# Patient Record
Sex: Female | Born: 1950
Health system: Southern US, Community
[De-identification: ages and names within clinical notes are randomized; demographics above are authoritative.]

## PROBLEM LIST (undated history)

## (undated) DIAGNOSIS — M791 Myalgia, unspecified site: Secondary | ICD-10-CM

## (undated) DIAGNOSIS — T7840XA Allergy, unspecified, initial encounter: Secondary | ICD-10-CM

## (undated) DIAGNOSIS — K635 Polyp of colon: Secondary | ICD-10-CM

## (undated) DIAGNOSIS — T466X5A Adverse effect of antihyperlipidemic and antiarteriosclerotic drugs, initial encounter: Secondary | ICD-10-CM

## (undated) DIAGNOSIS — N390 Urinary tract infection, site not specified: Secondary | ICD-10-CM

## (undated) DIAGNOSIS — E785 Hyperlipidemia, unspecified: Secondary | ICD-10-CM

## (undated) DIAGNOSIS — M8580 Other specified disorders of bone density and structure, unspecified site: Secondary | ICD-10-CM

## (undated) HISTORY — PX: UPPER GASTROINTESTINAL ENDOSCOPY: SHX188

## (undated) HISTORY — DX: Myalgia, unspecified site: M79.10

## (undated) HISTORY — DX: Hyperlipidemia, unspecified: E78.5

## (undated) HISTORY — DX: Urinary tract infection, site not specified: N39.0

## (undated) HISTORY — DX: Allergy, unspecified, initial encounter: T78.40XA

## (undated) HISTORY — PX: OTHER SURGICAL HISTORY: SHX169

## (undated) HISTORY — PX: POLYPECTOMY: SHX149

## (undated) HISTORY — PX: COLONOSCOPY: SHX174

## (undated) HISTORY — DX: Other specified disorders of bone density and structure, unspecified site: M85.80

## (undated) HISTORY — DX: Adverse effect of antihyperlipidemic and antiarteriosclerotic drugs, initial encounter: T46.6X5A

## (undated) HISTORY — DX: Polyp of colon: K63.5

---

## 2015-06-10 DIAGNOSIS — M8580 Other specified disorders of bone density and structure, unspecified site: Secondary | ICD-10-CM

## 2015-06-10 HISTORY — DX: Other specified disorders of bone density and structure, unspecified site: M85.80

## 2015-09-12 DIAGNOSIS — M8580 Other specified disorders of bone density and structure, unspecified site: Secondary | ICD-10-CM | POA: Insufficient documentation

## 2016-01-29 DIAGNOSIS — D126 Benign neoplasm of colon, unspecified: Secondary | ICD-10-CM | POA: Insufficient documentation

## 2016-06-20 DIAGNOSIS — E7800 Pure hypercholesterolemia, unspecified: Secondary | ICD-10-CM | POA: Diagnosis not present

## 2016-06-20 DIAGNOSIS — R319 Hematuria, unspecified: Secondary | ICD-10-CM | POA: Diagnosis not present

## 2016-06-20 DIAGNOSIS — N390 Urinary tract infection, site not specified: Secondary | ICD-10-CM | POA: Diagnosis not present

## 2016-07-23 DIAGNOSIS — E7800 Pure hypercholesterolemia, unspecified: Secondary | ICD-10-CM | POA: Diagnosis not present

## 2017-01-19 DIAGNOSIS — E7800 Pure hypercholesterolemia, unspecified: Secondary | ICD-10-CM | POA: Diagnosis not present

## 2017-01-19 DIAGNOSIS — M8580 Other specified disorders of bone density and structure, unspecified site: Secondary | ICD-10-CM | POA: Diagnosis not present

## 2017-01-27 DIAGNOSIS — E7800 Pure hypercholesterolemia, unspecified: Secondary | ICD-10-CM | POA: Diagnosis not present

## 2017-01-27 DIAGNOSIS — Z0000 Encounter for general adult medical examination without abnormal findings: Secondary | ICD-10-CM | POA: Diagnosis not present

## 2017-02-03 DIAGNOSIS — R7989 Other specified abnormal findings of blood chemistry: Secondary | ICD-10-CM | POA: Diagnosis not present

## 2017-02-03 DIAGNOSIS — E785 Hyperlipidemia, unspecified: Secondary | ICD-10-CM | POA: Diagnosis not present

## 2017-02-03 DIAGNOSIS — Z23 Encounter for immunization: Secondary | ICD-10-CM | POA: Diagnosis not present

## 2017-03-17 DIAGNOSIS — R799 Abnormal finding of blood chemistry, unspecified: Secondary | ICD-10-CM | POA: Diagnosis not present

## 2017-04-15 DIAGNOSIS — R799 Abnormal finding of blood chemistry, unspecified: Secondary | ICD-10-CM | POA: Diagnosis not present

## 2017-06-08 DIAGNOSIS — Z1231 Encounter for screening mammogram for malignant neoplasm of breast: Secondary | ICD-10-CM | POA: Diagnosis not present

## 2017-06-08 LAB — HM MAMMOGRAPHY

## 2017-07-25 DIAGNOSIS — J0100 Acute maxillary sinusitis, unspecified: Secondary | ICD-10-CM | POA: Diagnosis not present

## 2017-10-02 ENCOUNTER — Ambulatory Visit (INDEPENDENT_AMBULATORY_CARE_PROVIDER_SITE_OTHER): Payer: Medicare Other | Admitting: Family Medicine

## 2017-10-02 ENCOUNTER — Encounter: Payer: Self-pay | Admitting: Family Medicine

## 2017-10-02 ENCOUNTER — Other Ambulatory Visit: Payer: Self-pay

## 2017-10-02 VITALS — BP 110/82 | HR 63 | Temp 98.1°F | Ht 65.0 in | Wt 148.8 lb

## 2017-10-02 DIAGNOSIS — M7551 Bursitis of right shoulder: Secondary | ICD-10-CM

## 2017-10-02 DIAGNOSIS — D126 Benign neoplasm of colon, unspecified: Secondary | ICD-10-CM

## 2017-10-02 DIAGNOSIS — E2839 Other primary ovarian failure: Secondary | ICD-10-CM | POA: Diagnosis not present

## 2017-10-02 DIAGNOSIS — E7801 Familial hypercholesterolemia: Secondary | ICD-10-CM | POA: Diagnosis not present

## 2017-10-02 DIAGNOSIS — M8580 Other specified disorders of bone density and structure, unspecified site: Secondary | ICD-10-CM | POA: Diagnosis not present

## 2017-10-02 DIAGNOSIS — M791 Myalgia, unspecified site: Secondary | ICD-10-CM

## 2017-10-02 DIAGNOSIS — T466X5A Adverse effect of antihyperlipidemic and antiarteriosclerotic drugs, initial encounter: Secondary | ICD-10-CM

## 2017-10-02 HISTORY — DX: Myalgia, unspecified site: M79.10

## 2017-10-02 HISTORY — DX: Adverse effect of antihyperlipidemic and antiarteriosclerotic drugs, initial encounter: T46.6X5A

## 2017-10-02 MED ORDER — PITAVASTATIN CALCIUM 1 MG PO TABS: 1.0000 mg | ORAL_TABLET | Freq: Every day | ORAL | 11 refills | Status: DC

## 2017-10-02 NOTE — Patient Instructions (Addendum)
Thank you for establishing and allowing me to continue caring for you. It means a lot to me.   Please schedule a follow up appointment with me in 6-12 weeks for recheck of cholesterol.  Please come fasting for this appointment.    ICE, use anti-inflammatories and do your shoulder rehab exercises!  Order has been placed for the Bone Density and colonoscopy.  You will receive a call to schedule these!   Medicare recommends an Annual Wellness Visit for all patients. Please schedule this to be done with our Nurse Educator, Maudie Mercury. This is an informative "talk" visit; it's goals are to ensure that your health care needs are being met and to give you education regarding avoiding falls, ensuring you are not suffering from depression or problems with memory or thinking, and to educate you on Advance Care Planning. It helps me take good care of you!       Calcium Intake Recommendations You can take Caltrate Plus twice a day or get it through your diet or other OTC supplements (Viactiv, OsCal etc)  Calcium is a mineral that affects many functions in the body, including:  Blood clotting.  Blood vessel function.  Nerve impulse conduction.  Hormone secretion.  Muscle contraction.  Bone and teeth functions.  Most of your body's calcium supply is stored in your bones and teeth. When your calcium stores are low, you may be at risk for low bone mass, bone loss, and bone fractures. Consuming enough calcium helps to grow healthy bones and teeth and to prevent breakdown over time. It is very important that you get enough calcium if you are:  A child undergoing rapid growth.  An adolescent girl.  A pre- or post-menopausal woman.  A woman whose menstrual cycle has stopped due to anorexia nervosa or regular intense exercise.  An individual with lactose intolerance or a milk allergy.  A vegetarian.  What is my plan? Try to consume the recommended amount of calcium daily based on your age.  Depending on your overall health, your health care provider may recommend increased calcium intake.General daily calcium intake recommendations by age are:  Birth to 6 months: 200 mg.  Infants 7 to 12 months: 260 mg.  Children 1 to 3 years: 700 mg.  Children 4 to 8 years: 1,000 mg.  Children 9 to 13 years: 1,300 mg.  Teens 14 to 18 years: 1,300 mg.  Adults 19 to 50 years: 1,000 mg.  Adult women 51 to 70 years: 1,200 mg.  Adult men 51 to 70 years: 1,000 mg.  Adults 71 years and older: 1,200 mg.  Pregnant and breastfeeding teens: 1,300 mg.  Pregnant and breastfeeding adults: 1,000 mg.  What do I need to know about calcium intake?  In order for the body to absorb calcium, it needs vitamin D. You can get vitamin D through (we recommend getting [phone] units of Vitamin D daily) ? Direct exposure of the skin to sunlight. ? Foods, such as egg yolks, liver, saltwater fish, and fortified milk. ? Supplements.  Consuming too much calcium may cause: ? Constipation. ? Decreased absorption of iron and zinc. ? Kidney stones.  Calcium supplements may interact with certain medicines. Check with your health care provider before starting any calcium supplements.  Try to get most of your calcium from food. What foods can I eat? Grains  Fortified oatmeal. Fortified ready-to-eat cereals. Fortified frozen waffles. Vegetables Turnip greens. Broccoli. Fruits Fortified orange juice. Meats and Other Protein Sources Canned sardines with bones. Canned  salmon with bones. Soy beans. Tofu. Baked beans. Almonds. [place] nuts. Sunflower seeds. Dairy Milk. Yogurt. Cheese. Cottage cheese. Beverages Fortified soy milk. Fortified rice milk. Sweets/Desserts Pudding. Ice Cream. Milkshakes. Blackstrap molasses. The items listed above may not be a complete list of recommended foods or beverages. Contact your dietitian for more options. What foods can affect my calcium intake? It may be more  difficult for your body to use calcium or calcium may leave your body more quickly if you consume large amounts of:  Sodium.  Protein.  Caffeine.  Alcohol.  This information is not intended to replace advice given to you by your health care provider. Make sure you discuss any questions you have with your health care provider. Document Released: 01/08/2004 Document Revised: 12/14/2015 Document Reviewed: 11/01/2013 Elsevier Interactive Patient Education  2018 [employer].

## 2017-10-02 NOTE — Progress Notes (Signed)
Subjective  CC:  Chief Complaint  Patient presents with  . Establish Care    Relocated here from [place], [place], Last Phys per Patient in 07/2016  . Shoulder Pain    Right Shoulder Pain after working out at [employer]     HPI: Carolyn Gonzales is a 67 y.o. female who presents to [place] at [facility] today to establish care with me as a new patient.   She has the following concerns or needs:  Pleasant 67 year old female who has lived in [place] prior, moved to [place] where she raised her family, and now has returned.  Her daughter is married and lives here as well.  She has a son who lives in [place].  She is happy and overall very healthy.  We reviewed her past medical history in detail and outlined below.  I reviewed records from care everywhere.  She was receiving her health care at [facility] in [place].  She has untreated familial hypercholesterolemia with most recent LDL of 200 in November 2018.  She eats a healthy low-fat diet and exercises 5 to 7 days weekly.  She does aerobic training and weightbearing exercises.  She has been on Zocor and Vytorin, both gave her myalgias.  She never tried a different statin.  She has no known vascular disease.  She does have a family history of late onset coronary artery disease.  She is never had hypertension or diabetes.  She is a non-smoker.  She takes 81 mg aspirin daily for vascular disease prevention.  History of osteopenia by chart review.  Has a calcium rich diet but does not take supplements.  I do not have the records of her most recent scan.  She is due for a repeat.  Her mother did have osteoporosis.  She never took estrogen after menopause.  New complaint: Has right-sided shoulder pain worse at night while she is lying on that side.  Onset about 2 months ago.  Does exercise regularly at the gym including strength training, weightlifting and using the machines.  No injury.  She does have full range of motion at that  joint but notices some pain with lifting the arm in the morning.  No neck pain or radicular symptoms.  No weakness.  Colonoscopy in 2013 with tubular adenoma.  She is overdue for a surveillance colonoscopy.  No family history of colon cancer.  Of note he had false positive HTLV 2 antibodies results in the past.  Had further work-up with hematology showing all negative test results.  We updated and reviewed the patient's past history in detail and it is documented below.  Patient Active Problem List   Diagnosis Date Noted  . Myalgia due to statin 10/02/2017    Failed zocor, vytorin   . Familial hypercholesteremia 10/02/2017  . Tubular adenoma of colon 01/29/2016    Per MRs, 11/14/2011, recheck due 5 yrs   . Osteopenia 09/12/2015    06/15/14 DEXA scanned in MRs    Health Maintenance  Topic Date Due  . Hepatitis C Screening  17-Aug-1950  . COLONOSCOPY  11/13/2016  . DEXA SCAN  08/07/2017  . INFLUENZA VACCINE  01/07/2018  . MAMMOGRAM  06/08/2018  . TETANUS/TDAP  01/14/2026  . PNA vac Low Risk Adult  Completed   Immunization History  Administered Date(s) Administered  . Pneumococcal Conjugate-13 02/04/2016  . Pneumococcal Polysaccharide-23 02/03/2017  . Tdap 01/15/2016  . Zoster 01/15/2010   Current Meds  Medication Sig  . aspirin 81  MG EC tablet     Allergies: Patient is allergic to codeine and ezetimibe-simvastatin. Past Medical History Patient  has a past medical history of Allergy, Colon polyps, Hyperlipidemia, and Myalgia due to statin (10/02/2017). Past Surgical History Patient  has a past surgical history that includes tigal ligation. Family History: Patient family history includes Arthritis in her mother; Healthy in her daughter; Heart attack in her brother, father, maternal grandmother, and paternal grandfather; Heart disease in her brother, father, maternal grandmother, and paternal grandfather; Osteoporosis in her mother. Social History:  Patient  reports that she  has never smoked. She has never used smokeless tobacco. She reports that she drinks alcohol. She reports that she does not use drugs.  Review of Systems: Constitutional: negative for fever or malaise Ophthalmic: negative for photophobia, double vision or loss of vision Cardiovascular: negative for chest pain, dyspnea on exertion, or new LE swelling Respiratory: negative for SOB or persistent cough Gastrointestinal: negative for abdominal pain, change in bowel habits or melena Genitourinary: negative for dysuria or gross hematuria Musculoskeletal: negative for new gait disturbance or muscular weakness Integumentary: negative for new or persistent rashes Neurological: negative for TIA or stroke symptoms Psychiatric: negative for SI or delusions Allergic/Immunologic: negative for hives  Patient Care Team    Relationship Specialty Notifications Start End  Leamon Arnt, MD PCP - General Family Medicine  10/02/17   Arrie Eastern    10/02/17    Comment: Eye Doctor   Germaine Pomfret    10/02/17    Comment: Dentist     Objective  Vitals: BP 110/82   Pulse 63   Temp 98.1 F (36.7 C)   Ht 5\' 5"  (1.651 m)   Wt 148 lb 12.8 oz (67.5 kg)   BMI 24.76 kg/m  General:  Well developed, well nourished, no acute distress  Psych:  Alert and oriented,normal mood and affect HEENT:  Normocephalic, atraumatic, non-icteric sclera, PERRL, oropharynx is without mass or exudate, supple neck without adenopathy, mass or thyromegaly Cardiovascular:  RRR without gallop, rub or murmur, nondisplaced PMI Respiratory:  Good breath sounds bilaterally, CTAB with normal respiratory effort MSK: no deformities, contusions. Joints are without erythema or swelling Right shoulder: Full range of motion, negative empty can testing, nontender Skin:  Warm, no rashes or suspicious lesions noted Neurologic:    Mental status is normal. Gross motor and sensory exams are normal. Normal gait  Assessment  1. Familial  hypercholesteremia   2. Myalgia due to statin   3. Bursitis of right shoulder   4. Osteopenia, unspecified location   5. Tubular adenoma of colon   6. Estrogen deficiency      Plan   Discussed familial hypercholesterolemia with positive family history.  Recommend trying newer statins to see if she can tolerate them. Livalo low dose was ordered.  We will recheck in 6 to 12 weeks with cholesterol levels and titrate dose up if she is tolerating it.  Continue baby aspirin  Bursitis due to overuse of right shoulder: Rice therapy and rehab exercises once improving.  Follow-up if worsening recommend rest from exercise for 2 weeks  Ordered repeat bone density to follow-up on osteopenia.  Gave handout on calcium and vitamin D recommendations.  Add supplements as needed.  Referral to our GI for colonoscopy surveillance, overdue.  Due for annual wellness visit for Medicare.  Follow up:  Return in about 3 months (around 01/01/2018) for follow up hypercholesterolemia, AWV at patient's convenience.  Commons side effects, risks, benefits, and alternatives  for medications and treatment plan prescribed today were discussed, and the patient expressed understanding of the given instructions. Patient is instructed to call or message via MyChart if he/she has any questions or concerns regarding our treatment plan. No barriers to understanding were identified. We discussed Red Flag symptoms and signs in detail. Patient expressed understanding regarding what to do in case of urgent or emergency type symptoms.   Medication list was reconciled, printed and provided to the patient in AVS. Patient instructions and summary information was reviewed with the patient as documented in the AVS. This note was prepared with assistance of Dragon voice recognition software. Occasional wrong-word or sound-a-like substitutions may have occurred due to the inherent limitations of voice recognition software  Orders Placed This  Encounter  Procedures  . HM MAMMOGRAPHY  . DG Bone Density  . Ambulatory referral to Gastroenterology   Meds ordered this encounter  Medications  . Pitavastatin Calcium (LIVALO) 1 MG TABS    Sig: Take 1 tablet (1 mg total) by mouth at bedtime.    Dispense:  30 tablet    Refill:  11

## 2017-10-05 ENCOUNTER — Other Ambulatory Visit: Payer: Self-pay | Admitting: Emergency Medicine

## 2017-10-05 MED ORDER — PITAVASTATIN CALCIUM 1 MG PO TABS: 1.0000 mg | ORAL_TABLET | Freq: Every day | ORAL | 11 refills | Status: DC

## 2017-10-06 ENCOUNTER — Telehealth: Payer: Self-pay | Admitting: Gastroenterology

## 2017-10-06 NOTE — Telephone Encounter (Signed)
Dr. Fuller Plan reviewed records. Ok to schedule pt for a colon. Spoke with pt, she was driving and will cb to schedule.

## 2017-10-06 NOTE — Telephone Encounter (Signed)
We have received a referral from pt's PCP on 10/02/17 am requesting pt being seen for a repeat colon. Records have been received and placed on 10/02/17 am DOD Dr. Lynne Leader desk for review.

## 2017-10-07 ENCOUNTER — Telehealth: Payer: Self-pay | Admitting: Family Medicine

## 2017-10-07 NOTE — Telephone Encounter (Signed)
Copied from [place]. Topic: Quick Communication - Rx Refill/Question >> Oct 07, 2017  9:45 AM Scherrie Gerlach wrote: Medication: Pitavastatin Calcium (LIVALO) 1 MG TABS Pharmacy advised pt this med not covered by medicare, and they sent a request to the dr to change.  But the pharmacy had not heard anything back. Pt states she discussed what she could not take, (Zocor and Lipitor) and so whatever else Dr Jonni Sanger suggest  [facility] [address] [place] - [address] [phone] (Phone) [phone] (Fax)

## 2017-10-07 NOTE — Telephone Encounter (Signed)
Please change to crestor 10mg  po qhs. #30 5 refill.

## 2017-10-07 NOTE — Telephone Encounter (Signed)
Patient states that she has also tried Crestor.  She said that when she takes this she feels like she has the flu with body aches.  She is asking if there is any way that we could do more blood work to see if she truly needs to be on something before we try another medication.    Patient is aware that PCP is out of office this afternoon and tomorrow, she is aware that it may be Friday before she hears back from [place]. Patient aware that we will call her with information from Dr. Jonni Sanger once she returns.

## 2017-10-09 NOTE — Telephone Encounter (Signed)
Left detailed message for patient informing her of the notes below.  CRM created - okay for PEC to discuss

## 2017-10-09 NOTE — Telephone Encounter (Signed)
OK. She did not tell me she had tried crestor. Because livalo is expensive, I recommend holding off for now. I recalculated her cardiovascular risk score and am comfortable with her continuing her healthy lifestyle. The risk score will continue to increase as she ages; we can wait for now; livalo is brand name now; we can see in the future if she tolerates with samples, then go from there.  Thanks, Dr. Jonni Sanger

## 2017-10-21 ENCOUNTER — Telehealth: Payer: Self-pay | Admitting: Emergency Medicine

## 2017-10-21 NOTE — Telephone Encounter (Signed)
Ship broker. Insurance will cover Rouvastatin or Pravastatin.   Please Advise.    Doloris Hall,  LPN

## 2017-10-21 NOTE — Telephone Encounter (Signed)
See telephone note. Holding off on statin for now.

## 2017-10-21 NOTE — Telephone Encounter (Signed)
Noted  

## 2017-10-28 ENCOUNTER — Ambulatory Visit (INDEPENDENT_AMBULATORY_CARE_PROVIDER_SITE_OTHER): Payer: Medicare Other | Admitting: Family Medicine

## 2017-10-28 ENCOUNTER — Other Ambulatory Visit: Payer: Self-pay

## 2017-10-28 ENCOUNTER — Encounter: Payer: Self-pay | Admitting: Family Medicine

## 2017-10-28 ENCOUNTER — Ambulatory Visit: Payer: PRIVATE HEALTH INSURANCE

## 2017-10-28 VITALS — BP 110/68 | HR 65 | Temp 98.5°F | Resp 15 | Ht 65.0 in | Wt 149.2 lb

## 2017-10-28 DIAGNOSIS — R82998 Other abnormal findings in urine: Secondary | ICD-10-CM

## 2017-10-28 DIAGNOSIS — E2839 Other primary ovarian failure: Secondary | ICD-10-CM

## 2017-10-28 DIAGNOSIS — R399 Unspecified symptoms and signs involving the genitourinary system: Secondary | ICD-10-CM

## 2017-10-28 LAB — POCT URINALYSIS DIPSTICK
GLUCOSE UA: NEGATIVE
NITRITE UA: NEGATIVE
PROTEIN UA: POSITIVE — AB
Spec Grav, UA: >=1.03 — AB (ref 1.010–1.025)
Urobilinogen, UA: 0.2 E.U./dL
pH, UA: 5.5 (ref 5.0–8.0)

## 2017-10-28 MED ORDER — CIPROFLOXACIN HCL 500 MG PO TABS: 500.0000 mg | ORAL_TABLET | Freq: Two times a day (BID) | ORAL | 0 refills | Status: AC

## 2017-10-28 NOTE — Patient Instructions (Addendum)
Follow-up as Needed  Call Dr. Silvio Pate office to schedule Colonoscopy  [phone]   Urinary Tract Infection, Adult A urinary tract infection (UTI) is an infection of any part of the urinary tract. The urinary tract includes the:  Kidneys.  Ureters.  Bladder.  Urethra.  These organs make, store, and get rid of pee (urine) in the body. Follow these instructions at home:  Take over-the-counter and prescription medicines only as told by your doctor.  If you were prescribed an antibiotic medicine, take it as told by your doctor. Do not stop taking the antibiotic even if you start to feel better.  Avoid the following drinks: ? Alcohol. ? Caffeine. ? Tea. ? Carbonated drinks.  Drink enough fluid to keep your pee clear or pale yellow.  Keep all follow-up visits as told by your doctor. This is important.  Make sure to: ? Empty your bladder often and completely. Do not to hold pee for long periods of time. ? Empty your bladder before and after sex. ? Wipe from front to back after a bowel movement if you are female. Use each tissue one time when you wipe. Contact a doctor if:  You have back pain.  You have a fever.  You feel sick to your stomach (nauseous).  You throw up (vomit).  Your symptoms do not get better after 3 days.  Your symptoms go away and then come back. Get help right away if:  You have very bad back pain.  You have very bad lower belly (abdominal) pain.  You are throwing up and cannot keep down any medicines or water. This information is not intended to replace advice given to you by your health care provider. Make sure you discuss any questions you have with your health care provider. Document Released: 11/12/2007 Document Revised: 11/01/2015 Document Reviewed: 04/16/2015 Elsevier Interactive Patient Education  [employer].

## 2017-10-28 NOTE — Progress Notes (Signed)
Subjective   CC:  Chief Complaint  Patient presents with  . Urinary Frequency    Burning with urination, started this morning, going out of town    HPI: Carolyn Gonzales is a 67 y.o. female who presents to the office today to address the problems listed above in the chief complaint. Patient reports dysuria and urinary frequency.  She has sensation of increased urinary pressure.  She denies fevers flank pain nausea vomiting or gross hematuria.  Symptoms have been present for several hours to days.  She denies history of interstitial cystitis.  She denies vaginal symptoms including vaginal discharge or pelvic pain. Takes AZO twice a week  Assessment  1. UTI symptoms   2. Leukocytes in urine   3. Estrogen deficiency      Plan   Start cipro; await culture.   Reordered dexa: CMA left message for patient to call to schedule.   Pt needs to call Dr. Silvio Pate office to set up colonoscopy.  Follow up: prn  Orders Placed This Encounter  Procedures  . Urine Culture  . DG Bone Density  . POCT Urinalysis Dipstick   Meds ordered this encounter  Medications  . ciprofloxacin (CIPRO) 500 MG tablet    Sig: Take 1 tablet (500 mg total) by mouth 2 (two) times daily for 3 days.    Dispense:  6 tablet    Refill:  0      I reviewed the patients updated PMH, FH, and SocHx.    Patient Active Problem List   Diagnosis Date Noted  . Myalgia due to statin 10/02/2017  . Familial hypercholesteremia 10/02/2017  . Tubular adenoma of colon 01/29/2016  . Osteopenia 09/12/2015   Current Meds  Medication Sig  . aspirin 81 MG EC tablet   . Cranberry-Vitamin C-Probiotic (AZO CRANBERRY PO) Take 1 capsule by mouth 2 (two) times a week.    Review of Systems: Cardiovascular: negative for chest pain Respiratory: negative for SOB or persistent cough Gastrointestinal: negative for abdominal pain Constitutional: Negative for fever malaise or anorexia  Objective  Vitals: BP 110/68   Pulse 65   Temp  98.5 F (36.9 C) (Oral)   Resp 15   Ht 5\' 5"  (1.651 m)   Wt 149 lb 3.2 oz (67.7 kg)   SpO2 97%   BMI 24.83 kg/m  General: no acute distress  Psych:  Alert and oriented, normal mood and affect Cardiovascular:  RRR without murmur or gallop. no peripheral edema Respiratory:  Good breath sounds bilaterally, CTAB with normal respiratory effort Gastrointestinal: soft, flat abdomen, normal active bowel sounds, no palpable masses, no hepatosplenomegaly, no appreciated hernias, NO CVAT, mild suprapubic ttp w/o rebound or guarding Skin:  Warm, no rashes Neurologic:   Mental status is normal. normal gait Office Visit on 10/28/2017  Component Date Value Ref Range Status  . Color, UA 10/28/2017 dark   Final  . Clarity, UA 10/28/2017 cloudy   Final  . Glucose, UA 10/28/2017 Negative  Negative Final  . Bilirubin, UA 10/28/2017 1+   Final  . Ketones, UA 10/28/2017 trace   Final  . Spec Grav, UA 10/28/2017 >=1.030* 1.010 - 1.025 Final  . Blood, UA 10/28/2017 3+   Final  . pH, UA 10/28/2017 5.5  5.0 - 8.0 Final  . Protein, UA 10/28/2017 Positive* Negative Final  . Urobilinogen, UA 10/28/2017 0.2  0.2 or 1.0 E.U./dL Final  . Nitrite, UA 10/28/2017 negative   Final  . Leukocytes, UA 10/28/2017 Large (3+)* Negative Final  Commons side effects, risks, benefits, and alternatives for medications and treatment plan prescribed today were discussed, and the patient expressed understanding of the given instructions. Patient is instructed to call or message via MyChart if he/she has any questions or concerns regarding our treatment plan. No barriers to understanding were identified. We discussed Red Flag symptoms and signs in detail. Patient expressed understanding regarding what to do in case of urgent or emergency type symptoms.   Medication list was reconciled, printed and provided to the patient in AVS. Patient instructions and summary information was reviewed with the patient as documented in the  AVS. This note was prepared with assistance of Dragon voice recognition software. Occasional wrong-word or sound-a-like substitutions may have occurred due to the inherent limitations of voice recognition software

## 2017-10-30 LAB — URINE CULTURE
MICRO NUMBER: 90622669
SPECIMEN QUALITY:: ADEQUATE

## 2017-10-30 NOTE — Progress Notes (Signed)
+   UTI sensitive to abx used. No further action needed

## 2017-11-03 ENCOUNTER — Encounter: Payer: Self-pay | Admitting: Gastroenterology

## 2017-11-09 DIAGNOSIS — Z780 Asymptomatic menopausal state: Secondary | ICD-10-CM | POA: Diagnosis not present

## 2017-11-09 DIAGNOSIS — M8589 Other specified disorders of bone density and structure, multiple sites: Secondary | ICD-10-CM | POA: Diagnosis not present

## 2017-11-09 LAB — HM DEXA SCAN

## 2017-11-11 NOTE — Progress Notes (Signed)
Please call patient: I have reviewed his/her lab results. Her bone density results show persistent mild low bone density. Continue with weight bearing exercises, calcium and Vit D supplementation: 600mg  calcium twice a day and 1000-2000units of OTC Vitamin D daily.

## 2017-11-24 ENCOUNTER — Ambulatory Visit (INDEPENDENT_AMBULATORY_CARE_PROVIDER_SITE_OTHER): Payer: Medicare Other | Admitting: Family Medicine

## 2017-11-24 ENCOUNTER — Other Ambulatory Visit: Payer: Self-pay

## 2017-11-24 ENCOUNTER — Encounter: Payer: Self-pay | Admitting: Family Medicine

## 2017-11-24 VITALS — BP 138/86 | HR 61 | Temp 97.8°F | Ht 65.0 in | Wt 152.2 lb

## 2017-11-24 DIAGNOSIS — R399 Unspecified symptoms and signs involving the genitourinary system: Secondary | ICD-10-CM

## 2017-11-24 LAB — POCT URINALYSIS DIPSTICK
Bilirubin, UA: NEGATIVE
Glucose, UA: NEGATIVE
KETONES UA: NEGATIVE
NITRITE UA: NEGATIVE
PH UA: 6.5 (ref 5.0–8.0)
PROTEIN UA: POSITIVE — AB
Spec Grav, UA: 1.01 (ref 1.010–1.025)
UROBILINOGEN UA: 0.2 U/dL

## 2017-11-24 MED ORDER — AMOXICILLIN 875 MG PO TABS: 875.0000 mg | ORAL_TABLET | Freq: Two times a day (BID) | ORAL | 0 refills | Status: AC

## 2017-11-24 NOTE — Patient Instructions (Addendum)
Please return in August or September for your annual complete physical; please come fasting.   If you have any questions or concerns, please don't hesitate to send me a message via MyChart or call the office at [phone]. Thank you for visiting with [place] today! It's our pleasure caring for you.  Prevention of UTI include cranberry supplements or juice; emptying bladder frequently, especially after intercourse, wiping front to back after using the bathroom.  If needed we can add an estrogen cream or an intermittent preventive antibiotic.

## 2017-11-24 NOTE — Progress Notes (Signed)
Subjective   CC:  Chief Complaint  Patient presents with  . Dysuria    burning with urination, patient states she saw blood in urine this morning and has taken Azo today     HPI: Carolyn Gonzales is a 67 y.o. female who presents to the office today to address the problems listed above in the chief complaint.  Patient reports urinary pressure and one episode of gross hematuria this am. No dysuria. Had E.coli UTI at end of may treated and resolved with cipro. Had h/o recurrent uti's about 2-3 years ago evaluated by urology. Told to use cranberry AZO supplements as a preventive; this worked well and no longer taking. Took one this am. These sxs are postcoital. She is postmenopausal.  She has sensation of increased urinary pressure.  She denies fevers flank pain nausea vomiting or gross hematuria. She denies history of interstitial cystitis.  She denies vaginal symptoms including vaginal discharge or pelvic pain.   Assessment  1. UTI symptoms      Plan   Acute cystitis: treat with amox and await culture. Discussed preventive measures. Consider abx prophylactics or estrogen creams if recur.  HM: has colonoscopy set up for august with dr. Fuller Plan. Due for cpe in august/sept   Follow up: Return in about 3 months (around 02/24/2018) for complete physical.  Orders Placed This Encounter  Procedures  . Urine Culture  . POCT urinalysis dipstick   Meds ordered this encounter  Medications  . amoxicillin (AMOXIL) 875 MG tablet    Sig: Take 1 tablet (875 mg total) by mouth 2 (two) times daily for 3 days.    Dispense:  6 tablet    Refill:  0      I reviewed the patients updated PMH, FH, and SocHx.    Patient Active Problem List   Diagnosis Date Noted  . Myalgia due to statin 10/02/2017  . Familial hypercholesteremia 10/02/2017  . Tubular adenoma of colon 01/29/2016  . Osteopenia 09/12/2015   Current Meds  Medication Sig  . aspirin 81 MG EC tablet   . calcium-vitamin D 250-100 MG-UNIT  tablet Take 1 tablet by mouth 2 (two) times daily.  . Cranberry-Vitamin C-Probiotic (AZO CRANBERRY PO) Take 1 capsule by mouth 2 (two) times a week.    Review of Systems: Cardiovascular: negative for chest pain Respiratory: negative for SOB or persistent cough Gastrointestinal: negative for abdominal pain Constitutional: Negative for fever malaise or anorexia  Objective  Vitals: BP 138/86   Pulse 61   Temp 97.8 F (36.6 C)   Ht 5\' 5"  (1.651 m)   Wt 152 lb 3.2 oz (69 kg)   SpO2 98%   BMI 25.33 kg/m  General: no acute distress  Psych:  Alert and oriented, normal mood and affect  Office Visit on 11/24/2017  Component Date Value Ref Range Status  . Color, UA 11/24/2017 amber   Final  . Clarity, UA 11/24/2017 cloudy   Final  . Glucose, UA 11/24/2017 Negative  Negative Final  . Bilirubin, UA 11/24/2017 negative   Final  . Ketones, UA 11/24/2017 negative   Final  . Spec Grav, UA 11/24/2017 1.010  1.010 - 1.025 Final  . Blood, UA 11/24/2017 3+   Final  . pH, UA 11/24/2017 6.5  5.0 - 8.0 Final  . Protein, UA 11/24/2017 Positive* Negative Final  . Urobilinogen, UA 11/24/2017 0.2  0.2 or 1.0 E.U./dL Final  . Nitrite, UA 11/24/2017 negative   Final  . Leukocytes, UA 11/24/2017 Moderate (  2+)* Negative Final    Commons side effects, risks, benefits, and alternatives for medications and treatment plan prescribed today were discussed, and the patient expressed understanding of the given instructions. Patient is instructed to call or message via MyChart if he/she has any questions or concerns regarding our treatment plan. No barriers to understanding were identified. We discussed Red Flag symptoms and signs in detail. Patient expressed understanding regarding what to do in case of urgent or emergency type symptoms.   Medication list was reconciled, printed and provided to the patient in AVS. Patient instructions and summary information was reviewed with the patient as documented in the  AVS. This note was prepared with assistance of Dragon voice recognition software. Occasional wrong-word or sound-a-like substitutions may have occurred due to the inherent limitations of voice recognition software

## 2017-11-25 LAB — URINE CULTURE
MICRO NUMBER: 90729838
SPECIMEN QUALITY: ADEQUATE

## 2017-11-26 NOTE — Progress Notes (Signed)
Please call patient: I have reviewed his/her lab results. Urine culture returned negative. Her irritative symptoms could be coming from dehydration or other things. Let me know if they persist.

## 2017-11-30 ENCOUNTER — Ambulatory Visit: Payer: PRIVATE HEALTH INSURANCE | Admitting: Family Medicine

## 2017-12-13 DIAGNOSIS — N390 Urinary tract infection, site not specified: Secondary | ICD-10-CM | POA: Diagnosis not present

## 2018-01-20 ENCOUNTER — Ambulatory Visit (AMBULATORY_SURGERY_CENTER): Payer: Self-pay

## 2018-01-20 VITALS — Ht 65.0 in | Wt 146.8 lb

## 2018-01-20 DIAGNOSIS — Z86010 Personal history of colonic polyps: Secondary | ICD-10-CM

## 2018-01-20 MED ORDER — PEG 3350-KCL-NA BICARB-NACL 420 G PO SOLR: 4000.0000 mL | Freq: Once | ORAL | 0 refills | Status: AC

## 2018-01-20 NOTE — Progress Notes (Signed)
No egg or soy allergy known to patient  No issues with past sedation with any surgeries  or procedures, no intubation problems  No diet pills per patient No home 02 use per patient  No blood thinners per patient  Pt denies issues with constipation  No A fib or A flutter  EMMI video sent to pt's e mail. Pt denies  

## 2018-01-28 ENCOUNTER — Telehealth: Payer: Self-pay | Admitting: Gastroenterology

## 2018-01-28 NOTE — Telephone Encounter (Signed)
Returning a pt call about a prep question. Left a voicemail at 12:54, for her to call us back. Lucina Mellow RN

## 2018-01-28 NOTE — Telephone Encounter (Signed)
Pt was given PEG 3350 - it has no name brand of Golytely- pt needs to know if this is ok-  Instructed her this is the same as Golytely and to follow our instructions not instructions on the bottle- pt verbalized understanding

## 2018-02-03 ENCOUNTER — Encounter: Payer: Self-pay | Admitting: Gastroenterology

## 2018-02-03 ENCOUNTER — Ambulatory Visit (AMBULATORY_SURGERY_CENTER): Payer: Medicare Other | Admitting: Gastroenterology

## 2018-02-03 VITALS — BP 151/63 | HR 56 | Temp 96.9°F | Resp 12

## 2018-02-03 DIAGNOSIS — D123 Benign neoplasm of transverse colon: Secondary | ICD-10-CM

## 2018-02-03 DIAGNOSIS — D124 Benign neoplasm of descending colon: Secondary | ICD-10-CM

## 2018-02-03 DIAGNOSIS — Z86010 Personal history of colonic polyps: Secondary | ICD-10-CM

## 2018-02-03 DIAGNOSIS — Z1211 Encounter for screening for malignant neoplasm of colon: Secondary | ICD-10-CM | POA: Diagnosis not present

## 2018-02-03 MED ORDER — SODIUM CHLORIDE 0.9 % IV SOLN: 500.0000 mL | Freq: Once | INTRAVENOUS | Status: DC

## 2018-02-03 NOTE — Progress Notes (Signed)
Called to room to assist during endoscopic procedure.  Patient ID and intended procedure confirmed with present staff. Received instructions for my participation in the procedure from the performing physician.  

## 2018-02-03 NOTE — Patient Instructions (Signed)
HANDOUTS GIVEN FOR POLYPS, DIVERTICULOSIS AND HEMORRHOIDS  YOU HAD AN ENDOSCOPIC PROCEDURE TODAY AT THE [facility]:   Refer to the procedure report that was given to you for any specific questions about what was found during the examination.  If the procedure report does not answer your questions, please call your gastroenterologist to clarify.  If you requested that your care partner not be given the details of your procedure findings, then the procedure report has been included in a sealed envelope for you to review at your convenience later.  YOU SHOULD EXPECT: Some feelings of bloating in the abdomen. Passage of more gas than usual.  Walking can help get rid of the air that was put into your GI tract during the procedure and reduce the bloating. If you had a lower endoscopy (such as a colonoscopy or flexible sigmoidoscopy) you may notice spotting of blood in your stool or on the toilet paper. If you underwent a bowel prep for your procedure, you may not have a normal bowel movement for a few days.  Please Note:  You might notice some irritation and congestion in your nose or some drainage.  This is from the oxygen used during your procedure.  There is no need for concern and it should clear up in a day or so.  SYMPTOMS TO REPORT IMMEDIATELY:   Following lower endoscopy (colonoscopy or flexible sigmoidoscopy):  Excessive amounts of blood in the stool  Significant tenderness or worsening of abdominal pains  Swelling of the abdomen that is new, acute  Fever of 100F or higher  For urgent or emergent issues, a gastroenterologist can be reached at any hour by calling [phone].   DIET:  We do recommend a small meal at first, but then you may proceed to your regular diet.  Drink plenty of fluids but you should avoid alcoholic beverages for 24 hours.  ACTIVITY:  You should plan to take it easy for the rest of today and you should NOT DRIVE or use heavy machinery until tomorrow  (because of the sedation medicines used during the test).    FOLLOW UP: Our staff will call the number listed on your records the next business day following your procedure to check on you and address any questions or concerns that you may have regarding the information given to you following your procedure. If we do not reach you, we will leave a message.  However, if you are feeling well and you are not experiencing any problems, there is no need to return our call.  We will assume that you have returned to your regular daily activities without incident.  If any biopsies were taken you will be contacted by phone or by letter within the next 1-3 weeks.  Please call us at [phone] if you have not heard about the biopsies in 3 weeks.    SIGNATURES/CONFIDENTIALITY: You and/or your care partner have signed paperwork which will be entered into your electronic medical record.  These signatures attest to the fact that that the information above on your After Visit Summary has been reviewed and is understood.  Full responsibility of the confidentiality of this discharge information lies with you and/or your care-partner. 

## 2018-02-03 NOTE — Progress Notes (Signed)
Pt's states no medical or surgical changes since previsit or office visit. 

## 2018-02-03 NOTE — Progress Notes (Signed)
A and O x3. Report to RN. Tolerated MAC anesthesia well.

## 2018-02-03 NOTE — Op Note (Signed)
[facility] Patient Name: Carolyn Gonzales Procedure Date: 02/03/2018 8:02 AM MRN: [id number] Endoscopist: Ladene Artist , MD Age: 67 Referring MD:  Date of Birth: [birth year] Gender: Female Account #: [id number] Procedure:                Colonoscopy Indications:              Surveillance: Personal history of adenomatous                            polyps on last colonoscopy > 5 years ago Medicines:                Monitored Anesthesia Care Procedure:                Pre-Anesthesia Assessment:                           - Prior to the procedure, a History and Physical                            was performed, and patient medications and                            allergies were reviewed. The patient's tolerance of                            previous anesthesia was also reviewed. The risks                            and benefits of the procedure and the sedation                            options and risks were discussed with the patient.                            All questions were answered, and informed consent                            was obtained. Prior Anticoagulants: The patient has                            taken no previous anticoagulant or antiplatelet                            agents. ASA Grade Assessment: II - A patient with                            mild systemic disease. After reviewing the risks                            and benefits, the patient was deemed in                            satisfactory condition to undergo the procedure.  After obtaining informed consent, the colonoscope                            was passed under direct vision. Throughout the                            procedure, the patient's blood pressure, pulse, and                            oxygen saturations were monitored continuously. The                            Colonoscope was introduced through the anus and                            advanced to the the  cecum, identified by                            appendiceal orifice and ileocecal valve. The                            ileocecal valve, appendiceal orifice, and rectum                            were photographed. The quality of the bowel                            preparation was good. The colonoscopy was performed                            without difficulty. The patient tolerated the                            procedure well. Scope In: 8:08:58 AM Scope Out: 8:23:33 AM Scope Withdrawal Time: 0 hours 10 minutes 46 seconds  Total Procedure Duration: 0 hours 14 minutes 35 seconds  Findings:                 The perianal and digital rectal examinations were                            normal.                           A 8 mm polyp was found in the descending colon. The                            polyp was sessile. The polyp was removed with a                            cold snare. Resection and retrieval were complete.                           A 4 mm polyp was found in the transverse colon. The  polyp was sessile. The polyp was removed with a                            cold biopsy forceps. Resection and retrieval were                            complete.                           A few small-mouthed diverticula were found in the                            left colon.                           External hemorrhoids were found during retroflexion.                           The exam was otherwise without abnormality on                            direct and retroflexion views. Complications:            No immediate complications. Estimated blood loss:                            None. Estimated Blood Loss:     Estimated blood loss: none. Impression:               - One 8 mm polyp in the descending colon, removed                            with a cold snare. Resected and retrieved.                           - One 4 mm polyp in the transverse colon, removed                             with a cold biopsy forceps. Resected and retrieved.                           - Diverticulosis in the left colon.                           - External hemorrhoids.                           - The examination was otherwise normal on direct                            and retroflexion views. Recommendation:           - Repeat colonoscopy in 5 years for surveillance.                           - Patient has a contact number available for  emergencies. The signs and symptoms of potential                            delayed complications were discussed with the                            patient. Return to normal activities tomorrow.                            Written discharge instructions were provided to the                            patient.                           - Resume previous diet.                           - Continue present medications.                           - Await pathology results. Ladene Artist, MD 02/03/2018 8:27:29 AM This report has been signed electronically.

## 2018-02-04 ENCOUNTER — Telehealth: Payer: Self-pay

## 2018-02-04 NOTE — Telephone Encounter (Signed)
  Follow up Call-  Call back number 02/03/2018  Post procedure Call Back phone  # [phone]  Permission to leave phone message Yes     Patient questions:  Do you have a fever, pain , or abdominal swelling? No. Pain Score  0 *  Have you tolerated food without any problems? Yes.    Have you been able to return to your normal activities? Yes.    Do you have any questions about your discharge instructions: Diet   No. Medications  No. Follow up visit  No.  Do you have questions or concerns about your Care? No.  Actions: * If pain score is 4 or above: No action needed, pain <4.

## 2018-02-17 ENCOUNTER — Encounter: Payer: Self-pay | Admitting: Gastroenterology

## 2018-05-24 ENCOUNTER — Other Ambulatory Visit: Payer: Self-pay

## 2018-05-24 ENCOUNTER — Ambulatory Visit (INDEPENDENT_AMBULATORY_CARE_PROVIDER_SITE_OTHER): Payer: Medicare Other | Admitting: Family Medicine

## 2018-05-24 ENCOUNTER — Ambulatory Visit: Payer: Self-pay | Admitting: *Deleted

## 2018-05-24 ENCOUNTER — Encounter: Payer: Self-pay | Admitting: Family Medicine

## 2018-05-24 VITALS — BP 138/86 | HR 72 | Temp 98.1°F | Ht 65.0 in | Wt 152.2 lb

## 2018-05-24 DIAGNOSIS — R519 Headache, unspecified: Secondary | ICD-10-CM

## 2018-05-24 DIAGNOSIS — R51 Headache: Secondary | ICD-10-CM | POA: Diagnosis not present

## 2018-05-24 DIAGNOSIS — R030 Elevated blood-pressure reading, without diagnosis of hypertension: Secondary | ICD-10-CM

## 2018-05-24 DIAGNOSIS — Z1239 Encounter for other screening for malignant neoplasm of breast: Secondary | ICD-10-CM | POA: Diagnosis not present

## 2018-05-24 NOTE — Progress Notes (Signed)
Subjective  CC:  Chief Complaint  Patient presents with  . Hypertension    since last friday, 160's/85's  . Shoulder Pain    Left shoulder pain, pained stopped when she quit excercising and since returning to excercising it is hurting agiain     HPI: Carolyn Gonzales is a 67 y.o. female who presents to the office today to address the problems listed above in the chief complaint.  Healthy 67 year old female with history of hyperlipidemia but not hypertension presents due to elevated blood pressure readings at home.  3 days ago had an atypical headache, mild to moderate in nature.  At that time she checked her blood pressure and it was elevated.  Since, she is checked multiple times with persistent elevated readings.  See telephone triage note.  She has had no further headaches or any other neurologic symptoms.  She denies chest pain, palpitations, shortness of breath or lower extremity edema.  She is had no history of essential hypertension but she has had an elevated reading here and there in the past.  She is never been on blood pressure medications.  Overall she is happy and healthy.  Exercises 5 days a week at the gym using free weights and machines and bicycling for cardio.  She has had no exercise-induced symptoms.  Of note she did have right shoulder pain while doing a certain core exercise.  No pain when not doing that specific exercise.  She denies stressors, anxiety, mood changes.  She does worry because she is going out of town next week.  She worries about a stroke.  Health maintenance: Due for complete physical with lab work.  Due for mammogram.  Patient refuses influenza vaccinations.  Education given  Assessment  1. Elevated blood pressure reading without diagnosis of hypertension   2. Nonintractable headache, unspecified chronicity pattern, unspecified headache type   3. Breast cancer screening      Plan    Elevated blood pressure reading without history of hypertension:  Education and counseling given.  Blood pressure normalized with lying in the office for a few minutes.  Recommend daily breathing/relaxation exercises.  Continue home monitoring.  If elevated readings persist, will start antihypertensives at next visit.  Discussed hypertensive urgency or crisis and neurologic symptoms associated with hypertensive urgency's.  Hyperlipidemia f/u: Patient declines statins due to history of intolerance  Headache: Resolved.  No red flags.  Health maintenance: Schedule physical with blood work, order mammogram today. Education regarding management of these chronic disease states was given. Management strategies discussed on successive visits include dietary and exercise recommendations, goals of achieving and maintaining IBW, and lifestyle modifications aiming for adequate sleep and minimizing stressors.   Follow up: No follow-ups on file.  Orders Placed This Encounter  Procedures  . MM DIGITAL SCREENING BILATERAL   No orders of the defined types were placed in this encounter.     BP Readings from Last 3 Encounters:  05/24/18 138/86  02/03/18 (!) 151/63  11/24/17 138/86   Wt Readings from Last 3 Encounters:  05/24/18 152 lb 3.2 oz (69 kg)  01/20/18 146 lb 12.8 oz (66.6 kg)  11/24/17 152 lb 3.2 oz (69 kg)    No results found for: CHOL No results found for: HDL No results found for: LDLCALC No results found for: TRIG No results found for: CHOLHDL No results found for: LDLDIRECT No results found for: CREATININE, BUN, NA, K, CL, CO2  The ASCVD Risk score Mikey Bussing DC Jr., et al., 2013) failed  to calculate for the following reasons:   Cannot find a previous HDL lab  I reviewed the patients updated PMH, FH, and SocHx.    Patient Active Problem List   Diagnosis Date Noted  . Myalgia due to statin 10/02/2017  . Familial hypercholesteremia 10/02/2017  . Tubular adenoma of colon 01/29/2016  . Osteopenia 09/12/2015    Allergies: Codeine and  Ezetimibe-simvastatin  Social History: Patient  reports that she has never smoked. She has never used smokeless tobacco. She reports current alcohol use. She reports that she does not use drugs.  Current Meds  Medication Sig  . aspirin 81 MG EC tablet   . calcium-vitamin D 250-100 MG-UNIT tablet Take 1 tablet by mouth 2 (two) times daily.  . Cranberry-Vitamin C-Probiotic (AZO CRANBERRY PO) Take 1 capsule by mouth 2 (two) times a week.  . fluticasone (FLONASE) 50 MCG/ACT nasal spray     Review of Systems: Cardiovascular: negative for chest pain, palpitations, leg swelling, orthopnea Respiratory: negative for SOB, wheezing or persistent cough Gastrointestinal: negative for abdominal pain Genitourinary: negative for dysuria or gross hematuria  Objective  Vitals: BP 138/86   Pulse 72   Temp 98.1 F (36.7 C)   Ht 5\' 5"  (1.651 m)   Wt 152 lb 3.2 oz (69 kg)   SpO2 98%   BMI 25.33 kg/m  General: no acute distress  Psych:  Alert and oriented, normal mood and affect HEENT:  Normocephalic, atraumatic, supple neck  Cardiovascular:  RRR without murmur. no edema Respiratory:  Good breath sounds bilaterally, CTAB with normal respiratory effort Skin:  Warm, no rashes Neurologic:   Mental status is normal, no tremor  Commons side effects, risks, benefits, and alternatives for medications and treatment plan prescribed today were discussed, and the patient expressed understanding of the given instructions. Patient is instructed to call or message via MyChart if he/she has any questions or concerns regarding our treatment plan. No barriers to understanding were identified. We discussed Red Flag symptoms and signs in detail. Patient expressed understanding regarding what to do in case of urgent or emergency type symptoms.   Medication list was reconciled, printed and provided to the patient in AVS. Patient instructions and summary information was reviewed with the patient as documented in the  AVS. This note was prepared with assistance of Dragon voice recognition software. Occasional wrong-word or sound-a-like substitutions may have occurred due to the inherent limitations of voice recognition software

## 2018-05-24 NOTE — Telephone Encounter (Signed)
Pt called with complaints of high blood pressure; she says that she woke up with a nagging headache of Friday which resolved after taking aleve; she says her BP has been running high;  her readings are as follows: 05/24/18 177/90 at 0600 05/23/18 162/87 at 0600                 164/92 at 0900                 161/86 at 1600 05/22/18  145/82 at 2030                 162/80 at 0600 05/21/18  147/80 at 0600 Recommendations made per nurse triage protocol; pt offered and accepted appointment with Dr Billey Chang, [facility], 05/24/18 at 1500; she verbalized understanding; will route to office for notification of this upcoming appointment.  Reason for Disposition . Systolic BP  >= 778 OR Diastolic >= 242  Answer Assessment - Initial Assessment Questions 1. BLOOD PRESSURE: "What is the blood pressure?" "Did you take at least two measurements 5 minutes apart?"    Yes last measurement 05/24/18 177/90 and 05/23/18 161/86 at 1600   2. ONSET: "When did you take your blood pressure?"      3. HOW: "How did you obtain the blood pressure?" (e.g., visiting nurse, automatic home BP monitor)     Home automatic cuff left upper arm  4. HISTORY: "Do you have a history of high blood pressure?"     no 5. MEDICATIONS: "Are you taking any medications for blood pressure?" "Have you missed any doses recently?"     no 6. OTHER SYMPTOMS: "Do you have any symptoms?" (e.g., headache, chest pain, blurred vision, difficulty breathing, weakness)     no 7. PREGNANCY: "Is there any chance you are pregnant?" "When was your last menstrual period?"     no  Protocols used: HIGH BLOOD PRESSURE-A-AH

## 2018-05-24 NOTE — Patient Instructions (Addendum)
Please return for your annual complete physical; please come fasting.   If you have any questions or concerns, please don't hesitate to send me a message via MyChart or call the office at [phone]. Thank you for visiting with [place] today! It's our pleasure caring for you.  Avoid salt and caffeine for now.   We will call you with information regarding your referral appointment. Mammogram at the [facility].  If you do not hear from [place] within the next 2 weeks, please let me know. It can take 1-2 weeks to get appointments set up with the specialists.

## 2018-06-28 ENCOUNTER — Ambulatory Visit (INDEPENDENT_AMBULATORY_CARE_PROVIDER_SITE_OTHER): Payer: Medicare Other | Admitting: Family Medicine

## 2018-06-28 ENCOUNTER — Encounter: Payer: Self-pay | Admitting: Family Medicine

## 2018-06-28 VITALS — BP 122/80 | HR 64 | Temp 98.0°F | Resp 16 | Ht 65.0 in | Wt 154.0 lb

## 2018-06-28 DIAGNOSIS — Z1159 Encounter for screening for other viral diseases: Secondary | ICD-10-CM

## 2018-06-28 DIAGNOSIS — T466X5A Adverse effect of antihyperlipidemic and antiarteriosclerotic drugs, initial encounter: Secondary | ICD-10-CM | POA: Diagnosis not present

## 2018-06-28 DIAGNOSIS — M791 Myalgia, unspecified site: Secondary | ICD-10-CM | POA: Diagnosis not present

## 2018-06-28 DIAGNOSIS — E7801 Familial hypercholesterolemia: Secondary | ICD-10-CM

## 2018-06-28 DIAGNOSIS — R030 Elevated blood-pressure reading, without diagnosis of hypertension: Secondary | ICD-10-CM

## 2018-06-28 DIAGNOSIS — M8580 Other specified disorders of bone density and structure, unspecified site: Secondary | ICD-10-CM | POA: Diagnosis not present

## 2018-06-28 DIAGNOSIS — D126 Benign neoplasm of colon, unspecified: Secondary | ICD-10-CM | POA: Diagnosis not present

## 2018-06-28 LAB — COMPREHENSIVE METABOLIC PANEL
ALT: 15 U/L (ref 0–35)
AST: 19 U/L (ref 0–37)
Albumin: 4.1 g/dL (ref 3.5–5.2)
Alkaline Phosphatase: 46 U/L (ref 39–117)
BILIRUBIN TOTAL: 0.5 mg/dL (ref 0.2–1.2)
BUN: 21 mg/dL (ref 6–23)
CO2: 29 mEq/L (ref 19–32)
Calcium: 9.7 mg/dL (ref 8.4–10.5)
Chloride: 106 mEq/L (ref 96–112)
Creatinine, Ser: 0.99 mg/dL (ref 0.40–1.20)
GFR: 55.52 mL/min — ABNORMAL LOW (ref >60.00)
Glucose, Bld: 77 mg/dL (ref 70–99)
Potassium: 4.1 mEq/L (ref 3.5–5.1)
SODIUM: 144 meq/L (ref 135–145)
Total Protein: 6.9 g/dL (ref 6.0–8.3)

## 2018-06-28 LAB — CBC WITH DIFFERENTIAL/PLATELET
BASOS ABS: 0 10*3/uL (ref 0.0–0.1)
Basophils Relative: 0.4 % (ref 0.0–3.0)
Eosinophils Absolute: 0.1 10*3/uL (ref 0.0–0.7)
Eosinophils Relative: 1.5 % (ref 0.0–5.0)
HCT: 41.8 % (ref 36.0–46.0)
HEMOGLOBIN: 14.3 g/dL (ref 12.0–15.0)
Lymphocytes Relative: 27.4 % (ref 12.0–46.0)
Lymphs Abs: 1.6 10*3/uL (ref 0.7–4.0)
MCHC: 34.2 g/dL (ref 30.0–36.0)
MCV: 94.1 fl (ref 78.0–100.0)
Monocytes Absolute: 0.4 10*3/uL (ref 0.1–1.0)
Monocytes Relative: 7.2 % (ref 3.0–12.0)
Neutro Abs: 3.7 10*3/uL (ref 1.4–7.7)
Neutrophils Relative %: 63.5 % (ref 43.0–77.0)
Platelets: 223 10*3/uL (ref 150.0–400.0)
RBC: 4.44 Mil/uL (ref 3.87–5.11)
RDW: 13 % (ref 11.5–15.5)
WBC: 5.8 10*3/uL (ref 4.0–10.5)

## 2018-06-28 LAB — LIPID PANEL
Cholesterol: 332 mg/dL — ABNORMAL HIGH (ref 0–200)
HDL: 75 mg/dL (ref >39.00)
LDL Cholesterol: 229 mg/dL — ABNORMAL HIGH (ref 0–99)
NonHDL: 257.13
Total CHOL/HDL Ratio: 4
Triglycerides: 141 mg/dL (ref 0.0–149.0)
VLDL: 28.2 mg/dL (ref 0.0–40.0)

## 2018-06-28 LAB — TSH: TSH: 2.46 u[IU]/mL (ref 0.35–4.50)

## 2018-06-28 MED ORDER — ZOSTER VAC RECOMB ADJUVANTED 50 MCG/0.5ML IM SUSR: 0.5000 mL | Freq: Once | INTRAMUSCULAR | 0 refills | Status: AC

## 2018-06-28 NOTE — Patient Instructions (Signed)
Please return in 12 months for your annual complete physical; please come fasting.  Sooner if you note your blood pressure to be consistently elevated.  Start taking otc CoEnzyme Q10; this may help you tolerate a statin once or twice a week.   Please go get your shingrix when you can. I have given you a prescription today .  If you have any questions or concerns, please don't hesitate to send me a message via MyChart or call the office at [phone]. Thank you for visiting with [place] today! It's our pleasure caring for you.  Please do these things to maintain good health!   Exercise at least 30-45 minutes a day,  4-5 days a week.   Eat a low-fat diet with lots of fruits and vegetables, up to 7-9 servings per day.  Drink plenty of water daily. Try to drink 8 8oz glasses per day.  Seatbelts can save your life. Always wear your seatbelt.  Place Smoke Detectors on every level of your home and check batteries every year.  Schedule an appointment with an eye doctor for an eye exam every 1-2 years  Safe sex - use condoms to protect yourself from STDs if you could be exposed to these types of infections. Use birth control if you do not want to become pregnant and are sexually active.  Avoid heavy alcohol use. If you drink, keep it to less than 2 drinks/day and not every day.  [place].  Choose someone you trust that could speak for you if you became unable to speak for yourself.  Depression is common in our stressful world.If you're feeling down or losing interest in things you normally enjoy, please come in for a visit.  If anyone is threatening or hurting you, please get help. Physical or Emotional Violence is never OK.

## 2018-06-28 NOTE — Progress Notes (Signed)
Subjective  Chief Complaint  Patient presents with  . Annual Exam    Fasting  . Hypertension    Has been checking at home they vary but not as high as previous    HPI: Carolyn Gonzales is a 68 y.o. female who presents to [place] at [facility] today for a Female Wellness Visit. She also has the concerns and/or needs as listed above in the chief complaint. These will be addressed in addition to the Health Maintenance Visit.   Wellness Visit: annual visit with health maintenance review and exam without Pap  68 year old healthy female presents for wellness exam.  She is fasting today.  She is feeling very well.  Having her mammogram done this Friday.  Eye exam is up-to-date.  Bone density with osteopenia done in May.  She takes calcium and vitamin D.  Colon cancer screening is up-to-date.  Immunizations: Due for Shingrix.  Declines flu vaccination. Chronic disease f/u and/or acute problem visit: (deemed necessary to be done in addition to the wellness visit):  Elevated hypertension in December.  Has been checking twice daily and this has resolved.  Blood pressures averaging 120s to 130s over 80s.  Continues to exercise daily.  Normal energy levels.  History of hyperlipidemia with myalgias on statin.  Has never used with coenzyme Q 10.  Assessment  1. Familial hypercholesteremia   2. Myalgia due to statin   3. Elevated blood pressure reading without diagnosis of hypertension   4. Osteopenia, unspecified location   5. Need for hepatitis C screening test   6. Tubular adenoma of colon      Plan  Female Wellness Visit:  Age appropriate Health Maintenance and Prevention measures were discussed with patient. Included topics are cancer screening recommendations, ways to keep healthy (see AVS) including dietary and exercise recommendations, regular eye and dental care, use of seat belts, and avoidance of moderate alcohol use and tobacco use.  Await mammogram report  BMI:  discussed patient's BMI and encouraged positive lifestyle modifications to help get to or maintain a target BMI.  HM needs and immunizations were addressed and ordered. See below for orders. See HM and immunization section for updates.  Shingrix prescription given today.  Routine labs and screening tests ordered including cmp, cbc and lipids where appropriate.  Discussed recommendations regarding Vit D and calcium supplementation (see AVS)  She is due annual wellness visit at her convenience.  Chronic disease management visit and/or acute problem visit:  Elevated blood pressure without hypertension: Reassured.  Recheck in 6 to 12 months.  She will continue to check periodically at home.  Check lab work today.  Fasting for lipid recheck today.  Consider low-dose statin once to twice weekly with coenzyme Q 10.  Patient is willing to retry.  Osteopenia: Continue vitamin D, calcium and exercise.  Recheck in 2021. Follow up: Return in about 1 year (around 06/29/2019) for complete physical, AWV.  Orders Placed This Encounter  Procedures  . CBC with Differential/Platelet  . Comprehensive metabolic panel  . Hepatitis C antibody  . Lipid panel  . TSH   Meds ordered this encounter  Medications  . Zoster Vaccine Adjuvanted [facility]) injection    Sig: Inject 0.5 mLs into the muscle once for 1 dose. Please give 2nd dose 2-6 months after first dose    Dispense:  2 each    Refill:  0      Lifestyle: Body mass index is 25.63 kg/m. Wt Readings from Last 3 Encounters:  06/28/18 154 lb (69.9 kg)  05/24/18 152 lb 3.2 oz (69 kg)  01/20/18 146 lb 12.8 oz (66.6 kg)   Diet: low fat Exercise: frequently,   Patient Active Problem List   Diagnosis Date Noted  . Myalgia due to statin 10/02/2017    Failed zocor, vytorin   . Familial hypercholesteremia 10/02/2017  . Tubular adenoma of colon 01/29/2016    11/14/2011 and 2018.  Next due in 5 years.   . Osteopenia 09/12/2015    10/2017 DEXA:  osteopenia, lowest T = - 1.9 right femur. FRAX 18 and 3%; recheck 2 years.  06/15/14 DEXA scanned in MRs    Health Maintenance  Topic Date Due  . Hepatitis C Screening  01-19-1951  . MAMMOGRAM  06/08/2018  . DEXA SCAN  11/10/2019  . COLONOSCOPY  02/04/2023  . TETANUS/TDAP  01/14/2026  . PNA vac Low Risk Adult  Completed  . INFLUENZA VACCINE  Discontinued   Immunization History  Administered Date(s) Administered  . Pneumococcal Conjugate-13 02/04/2016  . Pneumococcal Polysaccharide-23 02/03/2017  . Tdap 01/15/2016  . Zoster 01/15/2010   We updated and reviewed the patient's past history in detail and it is documented below. Allergies: Patient is allergic to codeine and ezetimibe-simvastatin. Past Medical History Patient  has a past medical history of Allergy, Colon polyps, Hyperlipidemia, Myalgia due to statin (10/02/2017), Osteopenia (2017), and Recurrent UTI. Past Surgical History Patient  has a past surgical history that includes tigal ligation; Colonoscopy; Upper gastrointestinal endoscopy; and Polypectomy. Family History: Patient family history includes Arthritis in her mother; Healthy in her daughter; Heart attack in her brother, father, maternal grandmother, and paternal grandfather; Heart disease in her brother, father, maternal grandmother, and paternal grandfather; Osteoporosis in her mother. Social History:  Patient  reports that she has never smoked. She has never used smokeless tobacco. She reports current alcohol use. She reports that she does not use drugs.  Review of Systems: Constitutional: negative for fever or malaise Ophthalmic: negative for photophobia, double vision or loss of vision Cardiovascular: negative for chest pain, dyspnea on exertion, or new LE swelling Respiratory: negative for SOB or persistent cough Gastrointestinal: negative for abdominal pain, change in bowel habits or melena Genitourinary: negative for dysuria or gross hematuria, no abnormal  uterine bleeding or disharge Musculoskeletal: negative for new gait disturbance or muscular weakness Integumentary: negative for new or persistent rashes, no breast lumps Neurological: negative for TIA or stroke symptoms Psychiatric: negative for SI or delusions Allergic/Immunologic: negative for hives  Patient Care Team    Relationship Specialty Notifications Start End  Leamon Arnt, MD PCP - General Family Medicine  10/02/17   Arrie Eastern    10/02/17    Comment: Eye Doctor   Germaine Pomfret    10/02/17    Comment: Dentist     Objective  Vitals: BP 122/80   Pulse 64   Temp 98 F (36.7 C) (Oral)   Resp 16   Ht 5\' 5"  (1.651 m)   Wt 154 lb (69.9 kg)   SpO2 98%   BMI 25.63 kg/m  General:  Well developed, well nourished, no acute distress  Psych:  Alert and orientedx3,normal mood and affect HEENT:  Normocephalic, atraumatic, non-icteric sclera, PERRL, oropharynx is clear without mass or exudate, supple neck without adenopathy, mass or thyromegaly Cardiovascular:  Normal S1, S2, RRR without gallop, rub or murmur, nondisplaced PMI Respiratory:  Good breath sounds bilaterally, CTAB with normal respiratory effort Gastrointestinal: normal bowel sounds, soft, non-tender, no noted masses. No  HSM MSK: no deformities, contusions. Joints are without erythema or swelling. Spine and CVA region are nontender Skin:  Warm, no rashes or suspicious lesions noted Neurologic:    Mental status is normal. CN 2-11 are normal. Gross motor and sensory exams are normal. Normal gait. No tremor Breast Exam: No mass, skin retraction or nipple discharge is appreciated in either breast. No axillary adenopathy. Fibrocystic changes are not noted    Commons side effects, risks, benefits, and alternatives for medications and treatment plan prescribed today were discussed, and the patient expressed understanding of the given instructions. Patient is instructed to call or message via MyChart if he/she has any  questions or concerns regarding our treatment plan. No barriers to understanding were identified. We discussed Red Flag symptoms and signs in detail. Patient expressed understanding regarding what to do in case of urgent or emergency type symptoms.   Medication list was reconciled, printed and provided to the patient in AVS. Patient instructions and summary information was reviewed with the patient as documented in the AVS. This note was prepared with assistance of Dragon voice recognition software. Occasional wrong-word or sound-a-like substitutions may have occurred due to the inherent limitations of voice recognition software

## 2018-06-29 LAB — HEPATITIS C ANTIBODY
Hepatitis C Ab: NONREACTIVE
SIGNAL TO CUT-OFF: 0.02 (ref <1.00)

## 2018-06-29 MED ORDER — ROSUVASTATIN CALCIUM 5 MG PO TABS: 5.0000 mg | ORAL_TABLET | ORAL | 5 refills | Status: DC

## 2018-06-29 NOTE — Progress Notes (Signed)
Please call patient: I have reviewed his/her lab results. Everything looks good except for very high cholesterol. I have ordered crestor: we will see if she can tolerate low dose once or twice a weekly with the coQ10 as we discussed yesterday.

## 2018-06-29 NOTE — Addendum Note (Signed)
Addended by: Billey Chang on: 06/29/2018 01:13 PM   Modules accepted: Orders

## 2018-07-01 ENCOUNTER — Telehealth: Payer: Self-pay | Admitting: Family Medicine

## 2018-07-01 MED ORDER — AMOXICILLIN-POT CLAVULANATE 875-125 MG PO TABS: 1.0000 | ORAL_TABLET | Freq: Two times a day (BID) | ORAL | 0 refills | Status: DC

## 2018-07-01 NOTE — Telephone Encounter (Signed)
Copied from [place] [phone]. Topic: General - Inquiry >> Jul 01, 2018  8:22 AM Margot Ables wrote: Reason for CRM: Pt called stating she is having pressure in the front of her face, upper jaw is hurting, and congestion. Pt is using zyrtec and netipot but they are not helping now. Pt requesting medication be sent in for her and states Dr. Jonni Sanger offered on 06/28/2018.  [id number] [place], [place] [address] [phone] (Phone) [phone] (Fax)

## 2018-07-01 NOTE — Telephone Encounter (Signed)
ABX sent in for worsening sxs consistent with sinusitis.please notify pt.

## 2018-07-01 NOTE — Telephone Encounter (Signed)
Pt following up on request for abx. Pt now aware Dr Jonni Sanger id out of the office today, will return in the am.

## 2018-07-01 NOTE — Addendum Note (Signed)
Addended by: Billey Chang on: 07/01/2018 03:19 PM   Modules accepted: Orders

## 2018-07-02 ENCOUNTER — Ambulatory Visit
Admission: RE | Admit: 2018-07-02 | Discharge: 2018-07-02 | Disposition: A | Discharge status: Home / Self Care | Payer: Medicare Other | Source: Ambulatory Visit | Attending: Family Medicine | Admitting: Family Medicine

## 2018-07-02 DIAGNOSIS — Z1231 Encounter for screening mammogram for malignant neoplasm of breast: Secondary | ICD-10-CM | POA: Diagnosis not present

## 2018-07-02 DIAGNOSIS — Z1239 Encounter for other screening for malignant neoplasm of breast: Secondary | ICD-10-CM

## 2018-07-02 NOTE — Telephone Encounter (Signed)
Tried calling pt to notify RX was sent, unable to leave message.   PEC may give message to pt

## 2018-07-19 ENCOUNTER — Ambulatory Visit (INDEPENDENT_AMBULATORY_CARE_PROVIDER_SITE_OTHER): Payer: Medicare Other | Admitting: Family Medicine

## 2018-07-19 ENCOUNTER — Encounter: Payer: Self-pay | Admitting: Family Medicine

## 2018-07-19 VITALS — BP 118/68 | HR 64 | Temp 99.0°F | Resp 16 | Ht 65.0 in | Wt 154.8 lb

## 2018-07-19 DIAGNOSIS — J0100 Acute maxillary sinusitis, unspecified: Secondary | ICD-10-CM

## 2018-07-19 MED ORDER — AMOXICILLIN-POT CLAVULANATE 875-125 MG PO TABS: 1.0000 | ORAL_TABLET | Freq: Two times a day (BID) | ORAL | 0 refills | Status: AC

## 2018-07-19 NOTE — Patient Instructions (Signed)
Please follow up if symptoms do not improve or as needed.   Treat for 14 days and hold your zyrtec for now. Add mucinex.

## 2018-07-19 NOTE — Progress Notes (Signed)
Subjective   CC:  Chief Complaint  Patient presents with  . Sinusitis    Completed Augmentin, felt better for about 3 day. Reports that symptoms returned 07/14/18, she has been using flonase and zyrtec    HPI: Carolyn Gonzales is a 68 y.o. female who presents to the office today to address the problems listed above in the chief complaint. Patient presents due to recurring symptoms of sinusitis including left greater than right maxillary sinus tenderness and pressure associated with malaise and fatigue without shortness of breath, fevers, cough, sore throat or myalgias.  She does have thick drainage.  She completed a 7-day course of Augmentin which improved her symptoms but then they have recurred as noted above.  I reviewed the patients updated PMH, FH, and SocHx.    Patient Active Problem List   Diagnosis Date Noted  . Myalgia due to statin 10/02/2017  . Familial hypercholesteremia 10/02/2017  . Tubular adenoma of colon 01/29/2016  . Osteopenia 09/12/2015   Current Meds  Medication Sig  . aspirin 81 MG EC tablet   . calcium-vitamin D 250-100 MG-UNIT tablet Take 1 tablet by mouth 2 (two) times daily.  . Cranberry-Vitamin C-Probiotic (AZO CRANBERRY PO) Take 1 capsule by mouth 2 (two) times a week.  . fluticasone (FLONASE) 50 MCG/ACT nasal spray   . rosuvastatin (CRESTOR) 5 MG tablet Take 1 tablet (5 mg total) by mouth 2 (two) times a week.    Review of Systems: Cardiovascular: negative for chest pain Respiratory: negative for SOB or persistent cough Gastrointestinal: negative for abdominal pain Genitourinary: negative for dysuria or gross hematuria  Objective  Vitals: BP 118/68   Pulse 64   Temp 99 F (37.2 C) (Oral)   Resp 16   Ht 5\' 5"  (1.651 m)   Wt 154 lb 12.8 oz (70.2 kg)   SpO2 99%   BMI 25.76 kg/m  General: no acute distress  Psych:  Alert and oriented, normal mood and affect HEENT:  Normocephalic, atraumatic, TMs with serous effusions or retraction w/o  erythema, nasal mucosa is red with purulent drainage, tender maxillary sinus present, OP mild erythematous w/o eudate, supple neck without LAD Cardiovascular:  RRR without murmur or gallop. no peripheral edema Respiratory:  Good breath sounds bilaterally, CTAB with normal respiratory effort Skin:  Warm, no rashes Neurologic:   Mental status is normal. normal gait  Assessment  1. Subacute maxillary sinusitis      Plan    Sinusitis: History and exam is most consistent with bacterial sinus infection.  Augmentin for 2 more weeks ordered.  Patient to complete course of antibiotics, use supportive medications like mucolytics and decongestants as needed.  May use Tylenol or Advil if needed.  Symptoms should improve over the next 2 weeks.  Patient will return or call if symptoms persist or worsen.  Follow up: As needed   Commons side effects, risks, benefits, and alternatives for medications and treatment plan prescribed today were discussed, and the patient expressed understanding of the given instructions. Patient is instructed to call or message via MyChart if he/she has any questions or concerns regarding our treatment plan. No barriers to understanding were identified. We discussed Red Flag symptoms and signs in detail. Patient expressed understanding regarding what to do in case of urgent or emergency type symptoms.   Medication list was reconciled, printed and provided to the patient in AVS. Patient instructions and summary information was reviewed with the patient as documented in the AVS. This note was prepared  with assistance of Systems analyst. Occasional wrong-word or sound-a-like substitutions may have occurred due to the inherent limitations of voice recognition software  No orders of the defined types were placed in this encounter.  Meds ordered this encounter  Medications  . amoxicillin-clavulanate (AUGMENTIN) 875-125 MG tablet    Sig: Take 1 tablet by mouth 2  (two) times daily for 14 days.    Dispense:  28 tablet    Refill:  0

## 2018-07-29 ENCOUNTER — Telehealth: Payer: Self-pay

## 2018-07-29 MED ORDER — PREDNISONE 10 MG PO TABS: ORAL_TABLET | ORAL | 0 refills | Status: DC

## 2018-07-29 NOTE — Telephone Encounter (Signed)
Pt is aware.  

## 2018-07-29 NOTE — Addendum Note (Signed)
Addended by: Billey Chang on: 07/29/2018 01:27 PM   Modules accepted: Orders

## 2018-07-29 NOTE — Telephone Encounter (Signed)
Please let pt know that I sent in a prednisone taper.

## 2018-07-29 NOTE — Telephone Encounter (Signed)
Copied from [place] [phone]. Topic: General - Other >> Jul 29, 2018  1:01 PM Wynetta Emery, [place] C wrote: Reason for CRM: pt is calling in to make provider aware that she is not feeling any better and the amoxicillin isn't helping. Pt says that provider stated that she would send in prednisone if not better. Pt is requesting a Rx.   [id number] [place], [place] [phone] (Phone) [phone] (Fax)

## 2018-10-13 ENCOUNTER — Telehealth: Payer: Self-pay | Admitting: Family Medicine

## 2018-10-13 MED ORDER — ATORVASTATIN CALCIUM 10 MG PO TABS: ORAL_TABLET | ORAL | 3 refills | Status: DC

## 2018-10-13 NOTE — Addendum Note (Signed)
Addended by: Layla Barter on: 10/13/2018 11:34 AM   Modules accepted: Orders

## 2018-10-13 NOTE — Telephone Encounter (Signed)
Spoke with pt she reports that Lipitor is what caused muscle pain previously but, due to only taking 2x wk versus daily she is wanting to try it again. I have sent RX to preferred pharmacy

## 2018-10-13 NOTE — Telephone Encounter (Signed)
Can fill lipitor 10mg  2-3x/ week but please clarify because she has had muscle pain in the past with these medications?  And how much is 8 pills of crestor per month? 30 pills will last almost 4 months?

## 2018-10-13 NOTE — Telephone Encounter (Signed)
See note  Copied from [place] [phone]. Topic: General - Other >> Oct 13, 2018 10:12 AM Yvette Rack wrote: Reason for CRM: Pt requests a new Rx for either Lipitor or Zocor for her cholesterol. Pt stated she can not afford the other medication. Pt requests that [id number] [place], [place] [phone] (Phone) [phone] (Fax)

## 2018-10-13 NOTE — Telephone Encounter (Signed)
Please advise. She is currently taking Crester 5mg  twice a week

## 2019-03-17 ENCOUNTER — Other Ambulatory Visit: Payer: Self-pay

## 2019-03-17 ENCOUNTER — Ambulatory Visit (INDEPENDENT_AMBULATORY_CARE_PROVIDER_SITE_OTHER): Payer: Medicare Other

## 2019-03-17 ENCOUNTER — Encounter: Payer: Self-pay | Admitting: Family Medicine

## 2019-03-17 DIAGNOSIS — Z23 Encounter for immunization: Secondary | ICD-10-CM

## 2019-05-27 ENCOUNTER — Ambulatory Visit (INDEPENDENT_AMBULATORY_CARE_PROVIDER_SITE_OTHER): Payer: Medicare Other

## 2019-05-27 ENCOUNTER — Other Ambulatory Visit: Payer: Self-pay

## 2019-05-27 ENCOUNTER — Encounter (INDEPENDENT_AMBULATORY_CARE_PROVIDER_SITE_OTHER): Payer: Self-pay

## 2019-05-27 DIAGNOSIS — Z0000 Encounter for general adult medical examination without abnormal findings: Secondary | ICD-10-CM

## 2019-05-27 NOTE — Progress Notes (Signed)
This visit is being conducted via phone call due to the COVID-19 pandemic. This patient has given me verbal consent via phone to conduct this visit, patient states they are participating from their home address. Some vital signs may be absent or patient reported.   Patient identification: identified by name, DOB, and current address.  Location provider: [facility], Office Persons participating in the virtual visit: Denman George LPN, patient, spouse Olean Basher), and Dr. Billey Chang     Subjective:   Carolyn Gonzales is a 68 y.o. female who presents for an Initial Medicare Annual Wellness Visit.  Review of Systems     Cardiac Risk Factors include: advanced age (>68men, >40 women);dyslipidemia     Objective:    There were no vitals filed for this visit. There is no height or weight on file to calculate BMI.  Advanced Directives 05/27/2019  Does Patient Have a Medical Advance Directive? Yes  Type of Advance Directive Living will;Healthcare Power of [place] in Chart? No - copy requested    Current Medications (verified) Outpatient Encounter Medications as of 05/27/2019  Medication Sig  . aspirin 81 MG EC tablet   . atorvastatin (LIPITOR) 10 MG tablet 1 tab 2 times a week  . calcium-vitamin D 250-100 MG-UNIT tablet Take 1 tablet by mouth 2 (two) times daily.  . Cranberry-Vitamin C-Probiotic (AZO CRANBERRY PO) Take 1 capsule by mouth 2 (two) times a week.  . fluticasone (FLONASE) 50 MCG/ACT nasal spray   . [DISCONTINUED] predniSONE (DELTASONE) 10 MG tablet Take 4 tabs qd x 2 days, 3 qd x 2 days, 2 qd x 2d, 1qd x 3 days   No facility-administered encounter medications on file as of 05/27/2019.    Allergies (verified) Codeine and Ezetimibe-simvastatin   History: Past Medical History:  Diagnosis Date  . Allergy   . Colon polyps    Non- Maligant  . Hyperlipidemia   . Myalgia due to statin 10/02/2017  . Osteopenia 2017  .  Recurrent UTI    pt states she has not had a uti for two months.   Past Surgical History:  Procedure Laterality Date  . COLONOSCOPY    . POLYPECTOMY    . tigal ligation    . UPPER GASTROINTESTINAL ENDOSCOPY     Family History  Problem Relation Age of Onset  . Healthy Daughter   . Arthritis Mother   . Osteoporosis Mother   . Heart disease Father   . Heart attack Father   . Heart disease Brother   . Heart attack Brother   . Heart attack Maternal Grandmother   . Heart disease Maternal Grandmother   . Heart disease Paternal Grandfather   . Heart attack Paternal Grandfather   . Colon cancer Neg Hx   . Esophageal cancer Neg Hx   . Colon polyps Neg Hx   . Rectal cancer Neg Hx   . Stomach cancer Neg Hx    Social History   Socioeconomic History  . Marital status: Married    Spouse name: Not on file  . Number of children: 2  . Years of education: Not on file  . Highest education level: Not on file  Occupational History  . Occupation: Retired     Comment: Scientist, water quality  Tobacco Use  . Smoking status: Never Smoker  . Smokeless tobacco: Never Used  Substance and Sexual Activity  . Alcohol use: Yes    Comment: occasionally   . Drug use:  Never  . Sexual activity: Yes  Other Topics Concern  . Not on file  Social History Narrative  . Not on file   Social Determinants of Health   Financial Resource Strain:   . Difficulty of Paying Living Expenses: Not on file  Food Insecurity:   . Worried About Charity fundraiser in the Last Year: Not on file  . Ran Out of Food in the Last Year: Not on file  Transportation Needs:   . Lack of Transportation (Medical): Not on file  . Lack of Transportation (Non-Medical): Not on file  Physical Activity:   . Days of Exercise per Week: Not on file  . Minutes of Exercise per Session: Not on file  Stress:   . Feeling of Stress : Not on file  Social Connections:   . Frequency of Communication with Friends and Family: Not on file  .  Frequency of Social Gatherings with Friends and Family: Not on file  . Attends Religious Services: Not on file  . Active Member of Clubs or Organizations: Not on file  . Attends Archivist Meetings: Not on file  . Marital Status: Not on file    Tobacco Counseling Counseling given: Not Answered   Clinical Intake:  Pre-visit preparation completed: Yes  Pain : No/denies pain  Diabetes: No  How often do you need to have someone help you when you read instructions, pamphlets, or other written materials from your doctor or pharmacy?: 1 - Never  Interpreter Needed?: No  Information entered by :: Denman George LPN   Activities of Daily Living In your present state of health, do you have any difficulty performing the following activities: 05/27/2019 06/28/2018  Hearing? N N  Vision? N Y  Comment - Wears glasses  Difficulty concentrating or making decisions? N N  Walking or climbing stairs? N N  Dressing or bathing? N N  Doing errands, shopping? N N  Preparing Food and eating ? N -  Using the Toilet? N -  In the past six months, have you accidently leaked urine? N -  Do you have problems with loss of bowel control? N -  Managing your Medications? N -  Managing your Finances? N -  Housekeeping or managing your Housekeeping? N -  Some recent data might be hidden     Immunizations and Health Maintenance Immunization History  Administered Date(s) Administered  . Fluad Quad(high Dose 65+) 03/17/2019  . Pneumococcal Conjugate-13 02/04/2016  . Pneumococcal Polysaccharide-23 02/03/2017  . Tdap 01/15/2016  . Zoster 01/15/2010   There are no preventive care reminders to display for this patient.  Patient Care Team: Leamon Arnt, MD as PCP - General (Family Medicine) [place] any recent Medical Services you may have received from other than [facility] providers in the past year (date may be approximate).     Assessment:   This is a  routine wellness examination for [name].  Hearing/Vision screen No exam data present  Dietary issues and exercise activities discussed: Current Exercise Habits: Home exercise routine, Type of exercise: walking, Time (Minutes): 30, Frequency (Times/Week): 3, Weekly Exercise (Minutes/Week): 90, Intensity: Mild  Goals   None    Depression Screen PHQ 2/9 Scores 05/27/2019 06/28/2018 05/24/2018 10/02/2017  PHQ - 2 Score 0 0 0 0  PHQ- 9 Score - - - 0    Fall Risk Fall Risk  05/27/2019 06/28/2018 05/24/2018 10/02/2017  Falls in the past year? 0 0 0 No  Number  falls in past yr: - 0 - -  Injury with Fall? 0 0 - -  Follow up Falls evaluation completed;Education provided;Falls prevention discussed Falls evaluation completed - -    Is the patient's home free of loose throw rugs in walkways, pet beds, electrical cords, etc?   yes      Grab bars in the bathroom? yes      Handrails on the stairs?   yes      Adequate lighting?   yes   Cognitive Function: no cognitive concerns at this time  Cognitive Testing  Alert? Yes         Normal Appearance? N/a  Oriented to person? Yes           Place? Yes  Time? Yes  Recall of three objects? Yes  Can perform simple calculations? Yes  Displays appropriate judgment? Yes  Can read the correct time from a watch face? Yes   Screening Tests Health Maintenance  Topic Date Due  . MAMMOGRAM  07/03/2019  . DEXA SCAN  11/10/2019  . COLONOSCOPY  02/04/2023  . TETANUS/TDAP  01/14/2026  . Hepatitis C Screening  Completed  . PNA vac Low Risk Adult  Completed  . INFLUENZA VACCINE  Discontinued    Qualifies for Shingles Vaccine? Discussed and patient will check with pharmacy for coverage.  Patient education handout provided   Cancer Screenings: Lung: Low Dose CT Chest recommended if Age 3-80 years, 30 pack-year currently smoking OR have quit w/in 15years. Patient does not qualify. Breast: Up to date on Mammogram? Yes   Up to date of Bone Density/Dexa?  Yes Colorectal: colonoscopy 02/03/18 with Dr. Fuller Plan    Plan:  I have personally reviewed and addressed the Medicare Annual Wellness questionnaire and have noted the following in the patient's chart:  A. Medical and social history B. Use of alcohol, tobacco or illicit drugs  C. Current medications and supplements D. Functional ability and status E.  Nutritional status F.  Physical activity G. Advance directives H. List of other physicians I.  Hospitalizations, surgeries, and ER visits in previous 12 months J.  [place] such as hearing and vision if needed, cognitive and depression L. Referrals, records requested, and appointments- none   In addition, I have reviewed and discussed with patient certain preventive protocols, quality metrics, and best practice recommendations. A written personalized care plan for preventive services as well as general preventive health recommendations were provided to patient.   Signed,  Denman George, LPN  Nurse Health Advisor   Nurse Notes: no additional

## 2019-05-27 NOTE — Patient Instructions (Signed)
Carolyn Gonzales , Thank you for taking time to come for your Medicare Wellness Visit. I appreciate your ongoing commitment to your health goals. Please review the following plan we discussed and let me know if I can assist you in the future.   Screening recommendations/referrals: Colorectal Screening: up to date; last colonoscopy 02/03/18  Mammogram: up to date; last 07/02/18 Bone Density: up to date; last  11/09/17  Vision and Dental Exams: Recommended annual ophthalmology exams for early detection of glaucoma and other disorders of the eye Recommended annual dental exams for proper oral hygiene  Vaccinations: Influenza vaccine: completed 03/17/19 Pneumococcal vaccine: up to date; last 02/03/17 Tdap vaccine: up to date; last 01/15/16 Shingles vaccine: Please call your insurance company to determine your out of pocket expense for the Shingrix vaccine. You may receive this vaccine at your local pharmacy.  Advanced directives: Please bring a copy of your POA (Power of Attorney) and/or Living Will to your next appointment.  Goals: Recommend to drink at least 6-8 8oz glasses of water per day and consume a balanced diet rich in fresh fruits and vegetables.   Next appointment: Please schedule your Annual Wellness Visit with your Nurse Health Advisor in one year.  Preventive Care 70 Years and Older, Female Preventive care refers to lifestyle choices and visits with your health care provider that can promote health and wellness. What does preventive care include?  A yearly physical exam. This is also called an annual well check.  Dental exams once or twice a year.  Routine eye exams. Ask your health care provider how often you should have your eyes checked.  Personal lifestyle choices, including:  Daily care of your teeth and gums.  Regular physical activity.  Eating a healthy diet.  Avoiding tobacco and drug use.  Limiting alcohol use.  Practicing safe sex.  Taking low-dose aspirin every  day if recommended by your health care provider.  Taking vitamin and mineral supplements as recommended by your health care provider. What happens during an annual well check? The services and screenings done by your health care provider during your annual well check will depend on your age, overall health, lifestyle risk factors, and family history of disease. Counseling  Your health care provider may ask you questions about your:  Alcohol use.  Tobacco use.  Drug use.  Emotional well-being.  Home and relationship well-being.  Sexual activity.  Eating habits.  History of falls.  Memory and ability to understand (cognition).  Work and work Statistician.  Reproductive health. Screening  You may have the following tests or measurements:  Height, weight, and BMI.  Blood pressure.  Lipid and cholesterol levels. These may be checked every 5 years, or more frequently if you are over 70 years old.  Skin check.  Lung cancer screening. You may have this screening every year starting at age 44 if you have a 30-pack-year history of smoking and currently smoke or have quit within the past 15 years.  Fecal occult blood test (FOBT) of the stool. You may have this test every year starting at age 35.  Flexible sigmoidoscopy or colonoscopy. You may have a sigmoidoscopy every 5 years or a colonoscopy every 10 years starting at age 51.  Hepatitis C blood test.  Hepatitis B blood test.  Sexually transmitted disease (STD) testing.  Diabetes screening. This is done by checking your blood sugar (glucose) after you have not eaten for a while (fasting). You may have this done every 1-3 years.  Bone density  scan. This is done to screen for osteoporosis. You may have this done starting at age 52.  Mammogram. This may be done every 1-2 years. Talk to your health care provider about how often you should have regular mammograms. Talk with your health care provider about your test results,  treatment options, and if necessary, the need for more tests. Vaccines  Your health care provider may recommend certain vaccines, such as:  Influenza vaccine. This is recommended every year.  Tetanus, diphtheria, and acellular pertussis (Tdap, Td) vaccine. You may need a Td booster every 10 years.  Zoster vaccine. You may need this after age 91.  Pneumococcal 13-valent conjugate (PCV13) vaccine. One dose is recommended after age 35.  Pneumococcal polysaccharide (PPSV23) vaccine. One dose is recommended after age 60. Talk to your health care provider about which screenings and vaccines you need and how often you need them. This information is not intended to replace advice given to you by your health care provider. Make sure you discuss any questions you have with your health care provider. Document Released: 06/22/2015 Document Revised: 02/13/2016 Document Reviewed: 03/27/2015 Elsevier Interactive Patient Education  2017 [place] Prevention in the Home Falls can cause injuries. They can happen to people of all ages. There are many things you can do to make your home safe and to help prevent falls. What can I do on the outside of my home?  Regularly fix the edges of walkways and driveways and fix any cracks.  Remove anything that might make you trip as you walk through a door, such as a raised step or threshold.  Trim any bushes or trees on the path to your home.  Use bright outdoor lighting.  Clear any walking paths of anything that might make someone trip, such as rocks or tools.  Regularly check to see if handrails are loose or broken. Make sure that both sides of any steps have handrails.  Any raised decks and porches should have guardrails on the edges.  Have any leaves, snow, or ice cleared regularly.  Use sand or salt on walking paths during winter.  Clean up any spills in your garage right away. This includes oil or grease spills. What can I do in the  bathroom?  Use night lights.  Install grab bars by the toilet and in the tub and shower. Do not use towel bars as grab bars.  Use non-skid mats or decals in the tub or shower.  If you need to sit down in the shower, use a plastic, non-slip stool.  Keep the floor dry. Clean up any water that spills on the floor as soon as it happens.  Remove soap buildup in the tub or shower regularly.  Attach bath mats securely with double-sided non-slip rug tape.  Do not have throw rugs and other things on the floor that can make you trip. What can I do in the bedroom?  Use night lights.  Make sure that you have a light by your bed that is easy to reach.  Do not use any sheets or blankets that are too big for your bed. They should not hang down onto the floor.  Have a firm chair that has side arms. You can use this for support while you get dressed.  Do not have throw rugs and other things on the floor that can make you trip. What can I do in the kitchen?  Clean up any spills right away.  Avoid walking on wet  floors.  Keep items that you use a lot in easy-to-reach places.  If you need to reach something above you, use a strong step stool that has a grab bar.  Keep electrical cords out of the way.  Do not use floor polish or wax that makes floors slippery. If you must use wax, use non-skid floor wax.  Do not have throw rugs and other things on the floor that can make you trip. What can I do with my stairs?  Do not leave any items on the stairs.  Make sure that there are handrails on both sides of the stairs and use them. Fix handrails that are broken or loose. Make sure that handrails are as long as the stairways.  Check any carpeting to make sure that it is firmly attached to the stairs. Fix any carpet that is loose or worn.  Avoid having throw rugs at the top or bottom of the stairs. If you do have throw rugs, attach them to the floor with carpet tape.  Make sure that you have a  light switch at the top of the stairs and the bottom of the stairs. If you do not have them, ask someone to add them for you. What else can I do to help prevent falls?  Wear shoes that:  Do not have high heels.  Have rubber bottoms.  Are comfortable and fit you well.  Are closed at the toe. Do not wear sandals.  If you use a stepladder:  Make sure that it is fully opened. Do not climb a closed stepladder.  Make sure that both sides of the stepladder are locked into place.  Ask someone to hold it for you, if possible.  Clearly mark and make sure that you can see:  Any grab bars or handrails.  First and last steps.  Where the edge of each step is.  Use tools that help you move around (mobility aids) if they are needed. These include:  Canes.  Walkers.  Scooters.  Crutches.  Turn on the lights when you go into a dark area. Replace any light bulbs as soon as they burn out.  Set up your furniture so you have a clear path. Avoid moving your furniture around.  If any of your floors are uneven, fix them.  If there are any pets around you, be aware of where they are.  Review your medicines with your doctor. Some medicines can make you feel dizzy. This can increase your chance of falling. Ask your doctor what other things that you can do to help prevent falls. This information is not intended to replace advice given to you by your health care provider. Make sure you discuss any questions you have with your health care provider. Document Released: 03/22/2009 Document Revised: 11/01/2015 Document Reviewed: 06/30/2014 Elsevier Interactive Patient Education  2017 [employer]. 3

## 2019-06-30 ENCOUNTER — Other Ambulatory Visit: Payer: Self-pay

## 2019-07-01 ENCOUNTER — Encounter: Payer: Self-pay | Admitting: Family Medicine

## 2019-07-01 ENCOUNTER — Ambulatory Visit (INDEPENDENT_AMBULATORY_CARE_PROVIDER_SITE_OTHER): Payer: Medicare Other | Admitting: Family Medicine

## 2019-07-01 VITALS — BP 112/68 | HR 63 | Temp 97.2°F | Ht 65.0 in | Wt 159.0 lb

## 2019-07-01 DIAGNOSIS — E7801 Familial hypercholesterolemia: Secondary | ICD-10-CM

## 2019-07-01 DIAGNOSIS — T466X5A Adverse effect of antihyperlipidemic and antiarteriosclerotic drugs, initial encounter: Secondary | ICD-10-CM | POA: Diagnosis not present

## 2019-07-01 DIAGNOSIS — M791 Myalgia, unspecified site: Secondary | ICD-10-CM

## 2019-07-01 DIAGNOSIS — M8580 Other specified disorders of bone density and structure, unspecified site: Secondary | ICD-10-CM | POA: Diagnosis not present

## 2019-07-01 DIAGNOSIS — D126 Benign neoplasm of colon, unspecified: Secondary | ICD-10-CM | POA: Diagnosis not present

## 2019-07-01 DIAGNOSIS — E2839 Other primary ovarian failure: Secondary | ICD-10-CM

## 2019-07-01 LAB — COMPREHENSIVE METABOLIC PANEL
ALT: 15 U/L (ref 0–35)
AST: 22 U/L (ref 0–37)
Albumin: 4.3 g/dL (ref 3.5–5.2)
Alkaline Phosphatase: 45 U/L (ref 39–117)
BUN: 19 mg/dL (ref 6–23)
CO2: 22 mEq/L (ref 19–32)
Calcium: 9.3 mg/dL (ref 8.4–10.5)
Chloride: 108 mEq/L (ref 96–112)
Creatinine, Ser: 0.75 mg/dL (ref 0.40–1.20)
GFR: 76.27 mL/min (ref >60.00)
Glucose, Bld: 80 mg/dL (ref 70–99)
Potassium: 4.7 mEq/L (ref 3.5–5.1)
Sodium: 144 mEq/L (ref 135–145)
Total Bilirubin: 0.5 mg/dL (ref 0.2–1.2)
Total Protein: 7 g/dL (ref 6.0–8.3)

## 2019-07-01 LAB — CBC WITH DIFFERENTIAL/PLATELET
Basophils Absolute: 0.1 10*3/uL (ref 0.0–0.1)
Basophils Relative: 0.8 % (ref 0.0–3.0)
Eosinophils Absolute: 0.1 10*3/uL (ref 0.0–0.7)
Eosinophils Relative: 1.8 % (ref 0.0–5.0)
HCT: 42.7 % (ref 36.0–46.0)
Hemoglobin: 14.5 g/dL (ref 12.0–15.0)
Lymphocytes Relative: 35.9 % (ref 12.0–46.0)
Lymphs Abs: 2.6 10*3/uL (ref 0.7–4.0)
MCHC: 33.9 g/dL (ref 30.0–36.0)
MCV: 95.7 fl (ref 78.0–100.0)
Monocytes Absolute: 0.6 10*3/uL (ref 0.1–1.0)
Monocytes Relative: 8.3 % (ref 3.0–12.0)
Neutro Abs: 3.9 10*3/uL (ref 1.4–7.7)
Neutrophils Relative %: 53.2 % (ref 43.0–77.0)
Platelets: 214 10*3/uL (ref 150.0–400.0)
RBC: 4.46 Mil/uL (ref 3.87–5.11)
RDW: 13 % (ref 11.5–15.5)
WBC: 7.3 10*3/uL (ref 4.0–10.5)

## 2019-07-01 LAB — LIPID PANEL
Cholesterol: 281 mg/dL — ABNORMAL HIGH (ref 0–200)
HDL: 81.4 mg/dL (ref >39.00)
LDL Cholesterol: 184 mg/dL — ABNORMAL HIGH (ref 0–99)
NonHDL: 199.4
Total CHOL/HDL Ratio: 3
Triglycerides: 77 mg/dL (ref 0.0–149.0)
VLDL: 15.4 mg/dL (ref 0.0–40.0)

## 2019-07-01 LAB — TSH: TSH: 2.08 u[IU]/mL (ref 0.35–4.50)

## 2019-07-01 MED ORDER — SHINGRIX 50 MCG/0.5ML IM SUSR: 0.5000 mL | Freq: Once | INTRAMUSCULAR | 0 refills | Status: AC

## 2019-07-01 NOTE — Progress Notes (Signed)
Subjective  Chief Complaint  Patient presents with  . Annual Exam    HPI: Carolyn Gonzales is a 69 y.o. female who presents to [facility] today for a Female Wellness Visit. She also has the concerns and/or needs as listed above in the chief complaint. These will be addressed in addition to the Health Maintenance Visit.   Wellness Visit: annual visit with health maintenance review and exam without Pap   HM: due mammo. dexa due in June. Reviewed AWV : no depression or mental status issues. Due shingrix.  Covid vaccine on Tuesday.  Did well. Chronic disease f/u and/or acute problem visit: (deemed necessary to be done in addition to the wellness visit):  Familial hyperlipidemia: Now tolerating Crestor 10 twice weekly without any myalgias.  She has not tried increasing the dose.  She is not currently taking coenzyme Q 10.  Her diet is healthy.  She walks 5 to 7 days/week.  Osteopenia: Due for repeat bone density testing in June of this year.  Continues to take calcium and vitamin D with daily exercise  History of tubular adenoma due for next colonoscopy in 2024.  Studies done last year   Assessment  1. Familial hypercholesteremia   2. Tubular adenoma of colon   3. Myalgia due to statin   4. Osteopenia, unspecified location   5. Hypoestrogenism      Plan  Female Wellness Visit:  Age appropriate Health Maintenance and Prevention measures were discussed with patient. Included topics are cancer screening recommendations, ways to keep healthy (see AVS) including dietary and exercise recommendations, regular eye and dental care, use of seat belts, and avoidance of moderate alcohol use and tobacco use.   BMI: discussed patient's BMI and encouraged positive lifestyle modifications to help get to or maintain a target BMI.  HM needs and immunizations were addressed and ordered. See below for orders. See HM and immunization section for updates.  Gave patient  prescription to try to get the Shingrix vaccination series completed  Routine labs and screening tests ordered including cmp, cbc and lipids where appropriate.  Discussed recommendations regarding Vit D and calcium supplementation (see AVS)  Annual wellness visit up-to-date  Chronic disease management visit and/or acute problem visit:  Hyperlipidemia: We will try to increase Crestor dosing as tolerated.  Recheck lab work today.  Osteopenia: Recheck bone density in June.  Depending on progression, can consider repeating in 2 to 3 years.  Colon cancer screening up-to-date Follow up: Return in about 1 year (around 06/30/2020) for complete physical.  Orders Placed This Encounter  Procedures  . DG Bone Density  . CBC with Differential/Platelet  . Comprehensive metabolic panel  . Lipid panel  . TSH   Meds ordered this encounter  Medications  . Zoster Vaccine Adjuvanted [facility]) injection    Sig: Inject 0.5 mLs into the muscle once for 1 dose. Please give 2nd dose 2-6 months after first dose    Dispense:  2 each    Refill:  0      Lifestyle: Body mass index is 26.46 kg/m. Wt Readings from Last 3 Encounters:  07/01/19 159 lb (72.1 kg)  07/19/18 154 lb 12.8 oz (70.2 kg)  06/28/18 154 lb (69.9 kg)    Patient Active Problem List   Diagnosis Date Noted  . Myalgia due to statin 10/02/2017    Failed zocor, vytorin   . Familial hypercholesteremia 10/02/2017  . Tubular adenoma of colon 01/29/2016    11/14/2011 and  2019.  Next due in 5 years.   . Osteopenia 09/12/2015    10/2017 DEXA: osteopenia, lowest T = - 1.9 right femur. FRAX 18 and 3%; recheck 2 years.  06/15/14 DEXA scanned in MRs    Health Maintenance  Topic Date Due  . MAMMOGRAM  07/03/2019  . DEXA SCAN  11/10/2019  . COLONOSCOPY  02/04/2023  . TETANUS/TDAP  01/14/2026  . Hepatitis C Screening  Completed  . PNA vac Low Risk Adult  Completed  . INFLUENZA VACCINE  Discontinued   Immunization History  Administered  Date(s) Administered  . Fluad Quad(high Dose 65+) 03/17/2019  . PFIZER SARS-COV-2 Vaccination 06/28/2019  . Pneumococcal Conjugate-13 02/04/2016  . Pneumococcal Polysaccharide-23 02/03/2017  . Tdap 01/15/2016  . Zoster 01/15/2010   We updated and reviewed the patient's past history in detail and it is documented below. Allergies: Patient is allergic to codeine and ezetimibe-simvastatin. Past Medical History Patient  has a past medical history of Allergy, Colon polyps, Hyperlipidemia, Myalgia due to statin (10/02/2017), Osteopenia (2017), and Recurrent UTI. Past Surgical History Patient  has a past surgical history that includes tigal ligation; Colonoscopy; Upper gastrointestinal endoscopy; and Polypectomy. Family History: Patient family history includes Arthritis in her mother; Healthy in her daughter; Heart attack in her brother, father, maternal grandmother, and paternal grandfather; Heart disease in her brother, father, maternal grandmother, and paternal grandfather; Osteoporosis in her mother. Social History:  Patient  reports that she has never smoked. She has never used smokeless tobacco. She reports current alcohol use. She reports that she does not use drugs.  Review of Systems: Constitutional: negative for fever or malaise Ophthalmic: negative for photophobia, double vision or loss of vision Cardiovascular: negative for chest pain, dyspnea on exertion, or new LE swelling Respiratory: negative for SOB or persistent cough Gastrointestinal: negative for abdominal pain, change in bowel habits or melena Genitourinary: negative for dysuria or gross hematuria, no abnormal uterine bleeding or disharge Musculoskeletal: negative for new gait disturbance or muscular weakness Integumentary: negative for new or persistent rashes, no breast lumps Neurological: negative for TIA or stroke symptoms Psychiatric: negative for SI or delusions Allergic/Immunologic: negative for hives  Patient  Care Team    Relationship Specialty Notifications Start End  Leamon Arnt, MD PCP - General Family Medicine  10/02/17   Arrie Eastern    10/02/17    Comment: Eye Doctor   Germaine Pomfret    10/02/17    Comment: Dentist     Objective  Vitals: BP 112/68 (BP Location: Left Arm, Patient Position: Sitting, Cuff Size: Normal)   Pulse 63   Temp (!) 97.2 F (36.2 C) (Temporal)   Ht 5\' 5"  (1.651 m)   Wt 159 lb (72.1 kg)   SpO2 96%   BMI 26.46 kg/m  General:  Well developed, well nourished, no acute distress  Psych:  Alert and orientedx3,normal mood and affect HEENT:  Normocephalic, atraumatic, non-icteric sclera, PERRL, oropharynx is clear without mass or exudate, supple neck without adenopathy, mass or thyromegaly Cardiovascular:  Normal S1, S2, RRR without gallop, rub or murmur, nondisplaced PMI Respiratory:  Good breath sounds bilaterally, CTAB with normal respiratory effort Gastrointestinal: normal bowel sounds, soft, non-tender, no noted masses. No HSM MSK: no deformities, contusions. Joints are without erythema or swelling.  Mild OA changes in bilateral hands.  Spine and CVA region are nontender Skin:  Warm, no rashes or suspicious lesions noted, slightly darkened mole on left lower back without atypical features.  Will monitor Neurologic:  Mental status is normal. CN 2-11 are normal. Gross motor and sensory exams are normal. Normal gait. No tremor Breast Exam: No mass, skin retraction or nipple discharge is appreciated in either breast. No axillary adenopathy. Fibrocystic changes are not noted    Commons side effects, risks, benefits, and alternatives for medications and treatment plan prescribed today were discussed, and the patient expressed understanding of the given instructions. Patient is instructed to call or message via MyChart if he/she has any questions or concerns regarding our treatment plan. No barriers to understanding were identified. We discussed Red Flag symptoms and  signs in detail. Patient expressed understanding regarding what to do in case of urgent or emergency type symptoms.   Medication list was reconciled, printed and provided to the patient in AVS. Patient instructions and summary information was reviewed with the patient as documented in the AVS. This note was prepared with assistance of Dragon voice recognition software. Occasional wrong-word or sound-a-like substitutions may have occurred due to the inherent limitations of voice recognition software  This visit occurred during the SARS-CoV-2 public health emergency.  Safety protocols were in place, including screening questions prior to the visit, additional usage of staff PPE, and extensive cleaning of exam room while observing appropriate contact time as indicated for disinfecting solutions.

## 2019-07-01 NOTE — Patient Instructions (Addendum)
Please return in 12 months for your annual complete physical; please come fasting.  Try to increase your crestor to 3-7 x /week if tolerated adding the CoEnzyme Q10 daily if needed.  Please get your Shingrix vaccination when able.  I have ordered your Dexa scan for June 2021.   Glad you are well! If you have any questions or concerns, please don't hesitate to send me a message via MyChart or call the office at [phone]. Thank you for visiting with Carolyn Gonzales today! It's our pleasure caring for you.   Preventive Care 71 Years and Older, Female Preventive care refers to lifestyle choices and visits with your health care provider that can promote health and wellness. This includes:  A yearly physical exam. This is also called an annual well check.  Regular dental and eye exams.  Immunizations.  Screening for certain conditions.  Healthy lifestyle choices, such as diet and exercise. What can I expect for my preventive care visit? Physical exam Your health care provider will check:  Height and weight. These may be used to calculate body mass index (BMI), which is a measurement that tells if you are at a healthy weight.  Heart rate and blood pressure.  Your skin for abnormal spots. Counseling Your health care provider may ask you questions about:  Alcohol, tobacco, and drug use.  Emotional well-being.  Home and relationship well-being.  Sexual activity.  Eating habits.  History of falls.  Memory and ability to understand (cognition).  Work and work Statistician.  Pregnancy and menstrual history. What immunizations do I need?  Influenza (flu) vaccine  This is recommended every year. Tetanus, diphtheria, and pertussis (Tdap) vaccine  You may need a Td booster every 10 years. Varicella (chickenpox) vaccine  You may need this vaccine if you have not already been vaccinated. Zoster (shingles) vaccine  You may need this after age 85. Pneumococcal conjugate (PCV13)  vaccine  One dose is recommended after age 54. Pneumococcal polysaccharide (PPSV23) vaccine  One dose is recommended after age 101. Measles, mumps, and rubella (MMR) vaccine  You may need at least one dose of MMR if you were born in 1957 or later. You may also need a second dose. Meningococcal conjugate (MenACWY) vaccine  You may need this if you have certain conditions. Hepatitis A vaccine  You may need this if you have certain conditions or if you travel or work in places where you may be exposed to hepatitis A. Hepatitis B vaccine  You may need this if you have certain conditions or if you travel or work in places where you may be exposed to hepatitis B. Haemophilus influenzae type b (Hib) vaccine  You may need this if you have certain conditions. You may receive vaccines as individual doses or as more than one vaccine together in one shot (combination vaccines). Talk with your health care provider about the risks and benefits of combination vaccines. What tests do I need? Blood tests  Lipid and cholesterol levels. These may be checked every 5 years, or more frequently depending on your overall health.  Hepatitis C test.  Hepatitis B test. Screening  Lung cancer screening. You may have this screening every year starting at age 13 if you have a 30-pack-year history of smoking and currently smoke or have quit within the past 15 years.  Colorectal cancer screening. All adults should have this screening starting at age 74 and continuing until age 6. Your health care provider may recommend screening at age 1 if you  are at increased risk. You will have tests every 1-10 years, depending on your results and the type of screening test.  Diabetes screening. This is done by checking your blood sugar (glucose) after you have not eaten for a while (fasting). You may have this done every 1-3 years.  Mammogram. This may be done every 1-2 years. Talk with your health care provider about how  often you should have regular mammograms.  BRCA-related cancer screening. This may be done if you have a family history of breast, ovarian, tubal, or peritoneal cancers. Other tests  Sexually transmitted disease (STD) testing.  Bone density scan. This is done to screen for osteoporosis. You may have this done starting at age 49. Follow these instructions at home: Eating and drinking  Eat a diet that includes fresh fruits and vegetables, whole grains, lean protein, and low-fat dairy products. Limit your intake of foods with high amounts of sugar, saturated fats, and salt.  Take vitamin and mineral supplements as recommended by your health care provider.  Do not drink alcohol if your health care provider tells you not to drink.  If you drink alcohol: ? Limit how much you have to 0-1 drink a day. ? Be aware of how much alcohol is in your drink. In the [place] one drink equals one 12 oz bottle of beer (355 mL), one 5 oz glass of wine (148 mL), or one 1 oz glass of hard liquor (44 mL). Lifestyle  Take daily care of your teeth and gums.  Stay active. Exercise for at least 30 minutes on 5 or more days each week.  Do not use any products that contain nicotine or tobacco, such as cigarettes, e-cigarettes, and chewing tobacco. If you need help quitting, ask your health care provider.  If you are sexually active, practice safe sex. Use a condom or other form of protection in order to prevent STIs (sexually transmitted infections).  Talk with your health care provider about taking a low-dose aspirin or statin. What's next?  Go to your health care provider once a year for a well check visit.  Ask your health care provider how often you should have your eyes and teeth checked.  Stay up to date on all vaccines. This information is not intended to replace advice given to you by your health care provider. Make sure you discuss any questions you have with your health care provider. Document  Revised: 05/20/2018 Document Reviewed: 05/20/2018 Elsevier Patient Education  2020 [employer].

## 2019-07-01 NOTE — Progress Notes (Signed)
Letter sent to patient.

## 2019-07-08 ENCOUNTER — Other Ambulatory Visit: Payer: Self-pay | Admitting: Family Medicine

## 2019-07-08 DIAGNOSIS — Z1231 Encounter for screening mammogram for malignant neoplasm of breast: Secondary | ICD-10-CM

## 2019-07-08 DIAGNOSIS — M8580 Other specified disorders of bone density and structure, unspecified site: Secondary | ICD-10-CM

## 2019-07-08 DIAGNOSIS — E2839 Other primary ovarian failure: Secondary | ICD-10-CM

## 2019-07-11 ENCOUNTER — Telehealth: Payer: Self-pay | Admitting: Family Medicine

## 2019-07-11 ENCOUNTER — Ambulatory Visit
Admission: RE | Admit: 2019-07-11 | Discharge: 2019-07-11 | Disposition: A | Discharge status: Home / Self Care | Payer: Medicare Other | Source: Ambulatory Visit | Attending: Family Medicine | Admitting: Family Medicine

## 2019-07-11 ENCOUNTER — Other Ambulatory Visit: Payer: Self-pay

## 2019-07-11 DIAGNOSIS — Z1231 Encounter for screening mammogram for malignant neoplasm of breast: Secondary | ICD-10-CM

## 2019-07-11 NOTE — Telephone Encounter (Signed)
Pt called asking if someone could give her the lab results from her last visit. Please advise.

## 2019-07-12 NOTE — Telephone Encounter (Signed)
Please mail her a copy of her lab results.  All are normal; cholesterol levels are much improved on crestro twice weekly; but if she can tolerate a more frequent dosing as we discussed this would continue to lower her levels to a more acceptable range.  Thanks!

## 2019-07-12 NOTE — Telephone Encounter (Signed)
Please advise do not see note on labs

## 2019-07-14 NOTE — Telephone Encounter (Signed)
Called patient reviewed labs and mailed copy to home address after verified.

## 2019-09-19 ENCOUNTER — Other Ambulatory Visit: Payer: Self-pay

## 2019-09-19 ENCOUNTER — Telehealth: Payer: Self-pay | Admitting: Family Medicine

## 2019-09-19 MED ORDER — ATORVASTATIN CALCIUM 10 MG PO TABS: ORAL_TABLET | ORAL | 3 refills | Status: AC

## 2019-09-19 NOTE — Telephone Encounter (Signed)
Patient is aware that new script was sent to pharmacy

## 2019-09-19 NOTE — Telephone Encounter (Signed)
Yes, please reorder at once nightly, #90 w/ 3 refills and she can take as she can tolerate. Thanks.

## 2019-09-19 NOTE — Telephone Encounter (Signed)
  LAST APPOINTMENT DATE: 07/11/2019   NEXT APPOINTMENT DATE:@Visit  date not found  MEDICATION:atorvastatin (LIPITOR) 10 MG tablet  PHARMACY:[id number] - , [place] - [address]  COMMENTS: Patient states her and Dr.Andy discussed increasing the medication at last visit and wanted to know if Dr.Andy could send in a new prescription with the directions of taking it 5 times a week   **Let patient know to contact pharmacy at the end of the day to make sure medication is ready. **  ** Please notify patient to allow 48-72 hours to process**  **Encourage patient to contact the pharmacy for refills or they can request refills through [facility]**  CLINICAL FILLS OUT ALL BELOW:   LAST REFILL:  QTY:  REFILL DATE:    OTHER COMMENTS:    Okay for refill?  Please advise

## 2019-11-11 ENCOUNTER — Other Ambulatory Visit: Payer: No Typology Code available for payment source

## 2019-12-14 ENCOUNTER — Other Ambulatory Visit: Payer: Self-pay

## 2019-12-14 ENCOUNTER — Ambulatory Visit
Admission: RE | Admit: 2019-12-14 | Discharge: 2019-12-14 | Disposition: A | Discharge status: Home / Self Care | Payer: Medicare Other | Source: Ambulatory Visit | Attending: Family Medicine | Admitting: Family Medicine

## 2019-12-14 ENCOUNTER — Other Ambulatory Visit: Payer: Self-pay | Admitting: Internal Medicine

## 2019-12-14 DIAGNOSIS — M8580 Other specified disorders of bone density and structure, unspecified site: Secondary | ICD-10-CM

## 2019-12-14 DIAGNOSIS — E2839 Other primary ovarian failure: Secondary | ICD-10-CM

## 2020-07-03 ENCOUNTER — Encounter: Payer: No Typology Code available for payment source | Admitting: Family Medicine

## 2020-08-28 ENCOUNTER — Other Ambulatory Visit: Payer: Self-pay | Admitting: Internal Medicine

## 2020-08-28 DIAGNOSIS — Z0000 Encounter for general adult medical examination without abnormal findings: Secondary | ICD-10-CM

## 2020-10-24 ENCOUNTER — Other Ambulatory Visit: Payer: Self-pay

## 2020-10-24 ENCOUNTER — Ambulatory Visit
Admission: RE | Admit: 2020-10-24 | Discharge: 2020-10-24 | Disposition: A | Discharge status: Home / Self Care | Payer: Medicare Other | Source: Ambulatory Visit | Attending: Internal Medicine | Admitting: Internal Medicine

## 2020-10-24 DIAGNOSIS — Z0000 Encounter for general adult medical examination without abnormal findings: Secondary | ICD-10-CM

## 2021-01-14 ENCOUNTER — Other Ambulatory Visit (HOSPITAL_COMMUNITY): Payer: Self-pay | Admitting: Internal Medicine

## 2021-01-14 DIAGNOSIS — I313 Pericardial effusion (noninflammatory): Secondary | ICD-10-CM

## 2021-01-14 DIAGNOSIS — I3139 Other pericardial effusion (noninflammatory): Secondary | ICD-10-CM

## 2021-01-15 ENCOUNTER — Other Ambulatory Visit: Payer: Self-pay

## 2021-01-15 ENCOUNTER — Ambulatory Visit (HOSPITAL_COMMUNITY)
Admission: RE | Admit: 2021-01-15 | Discharge: 2021-01-15 | Disposition: A | Discharge status: Home / Self Care | Payer: Medicare Other | Source: Ambulatory Visit | Attending: Internal Medicine | Admitting: Internal Medicine

## 2021-01-15 DIAGNOSIS — I313 Pericardial effusion (noninflammatory): Secondary | ICD-10-CM | POA: Diagnosis present

## 2021-01-15 DIAGNOSIS — E785 Hyperlipidemia, unspecified: Secondary | ICD-10-CM | POA: Insufficient documentation

## 2021-01-15 DIAGNOSIS — I351 Nonrheumatic aortic (valve) insufficiency: Secondary | ICD-10-CM | POA: Diagnosis not present

## 2021-01-15 DIAGNOSIS — I3139 Other pericardial effusion (noninflammatory): Secondary | ICD-10-CM

## 2021-01-15 LAB — ECHOCARDIOGRAM COMPLETE
AV Vena cont: 0.3 cm
Area-P 1/2: 2.31 cm2
P 1/2 time: 666 msec
S' Lateral: 2.8 cm

## 2021-02-18 IMAGING — MG DIGITAL SCREENING BILAT W/ TOMO W/ CAD
8 series · 8 of 24 positions shown · non-contrast
Comparison: Previous exam(s).

CLINICAL DATA: Screening.

EXAM:
DIGITAL SCREENING BILATERAL MAMMOGRAM WITH TOMO AND CAD

[R MLO synth-2D]
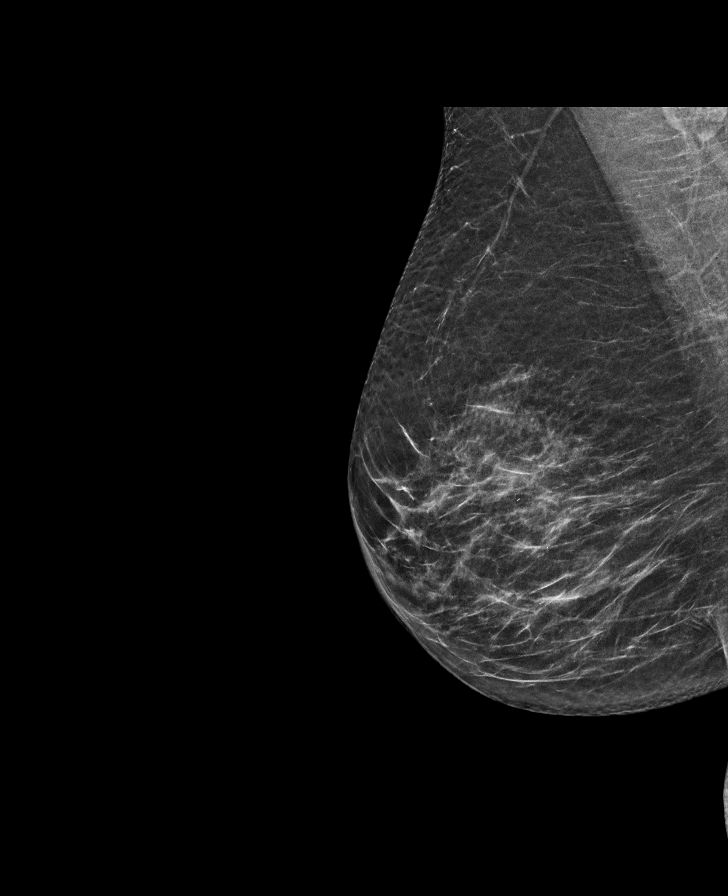

[L CC synth-2D]
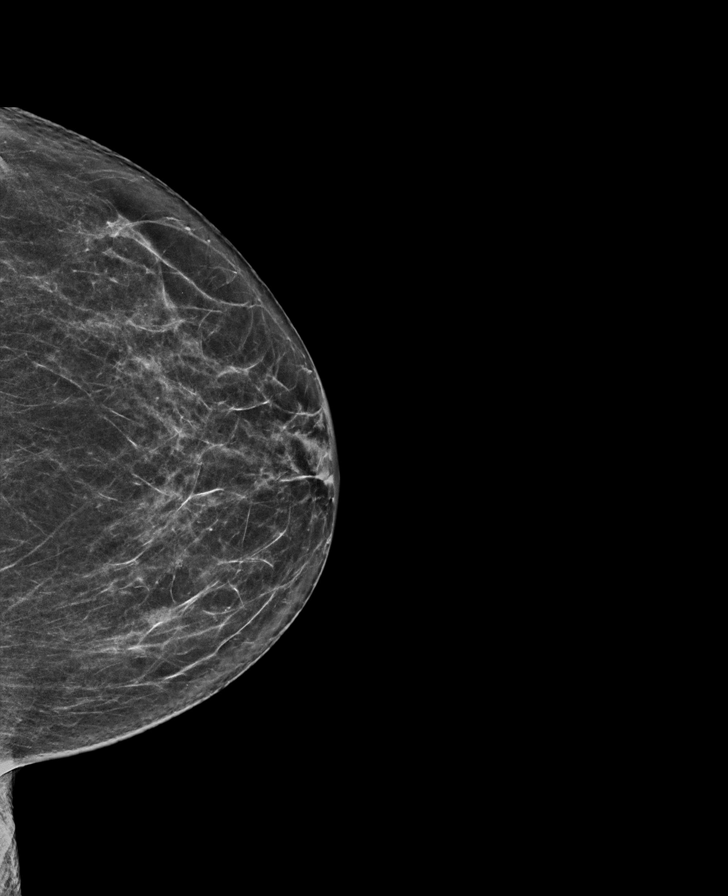

[R CC synth-2D]
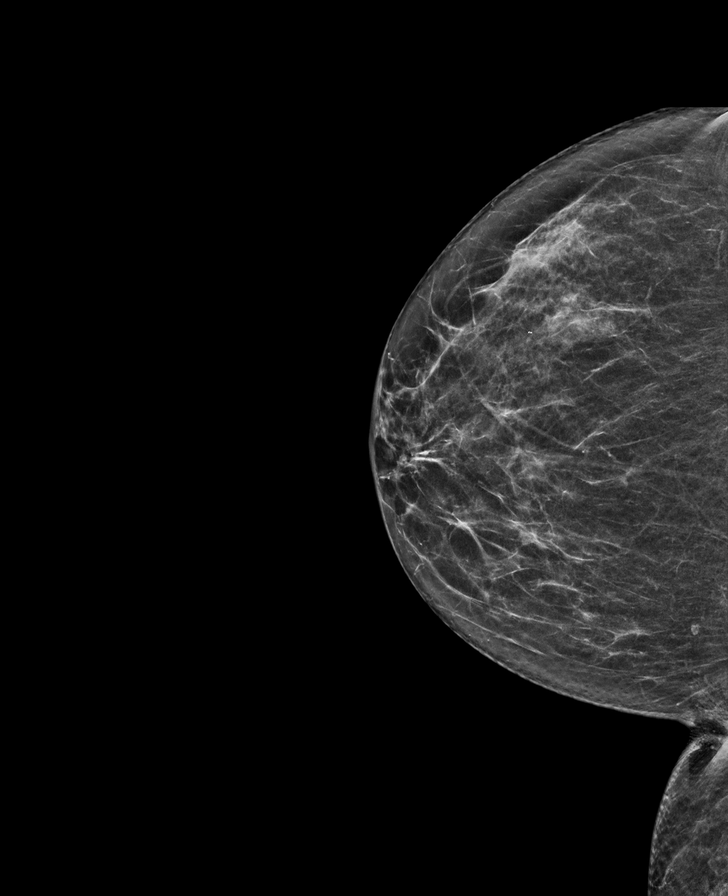

[L MLO synth-2D]
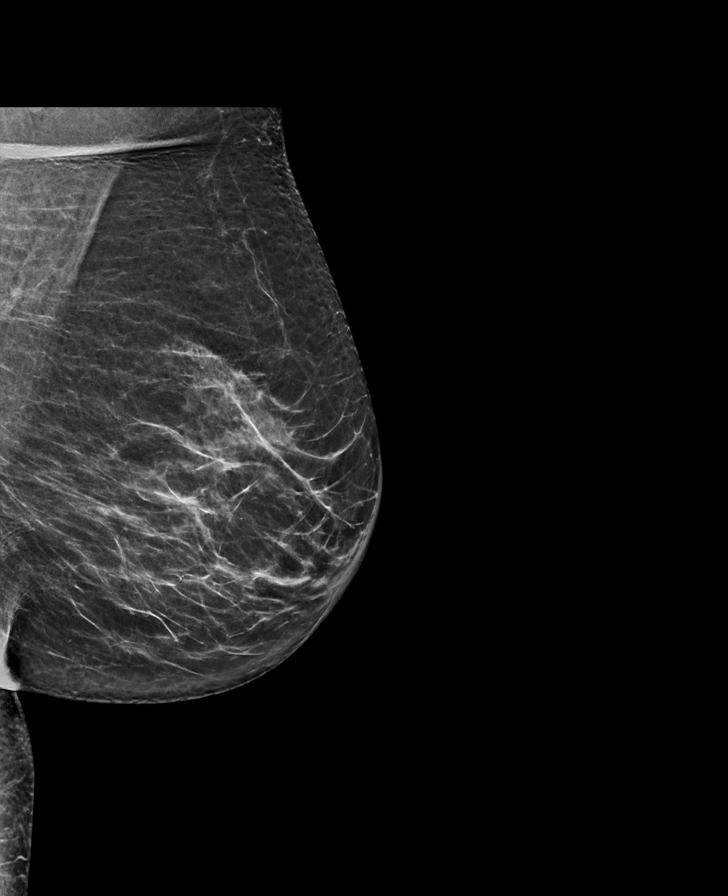

[R CC tomo · tomo slice 31/62.0]
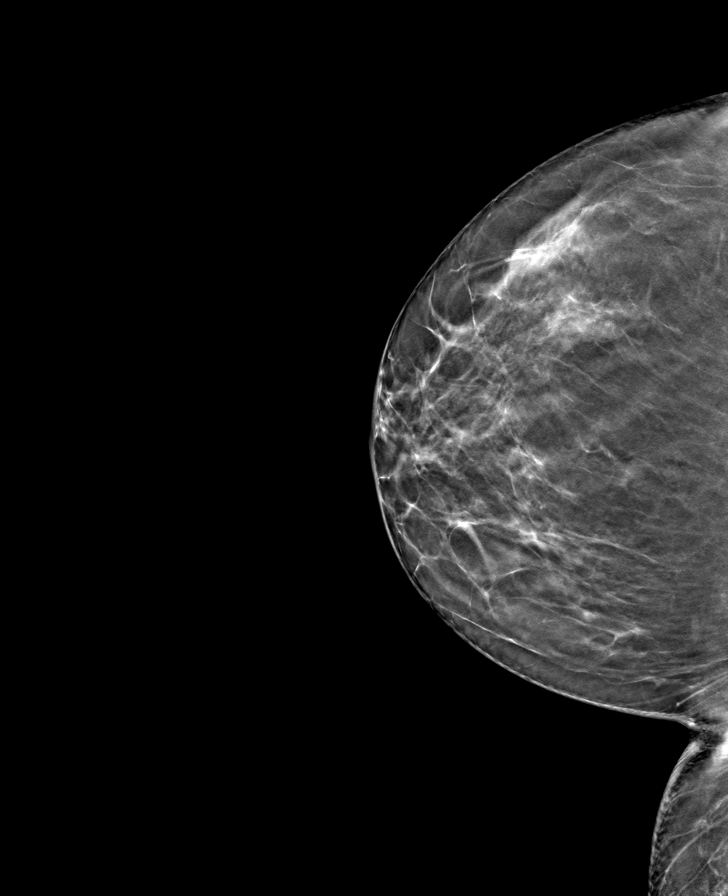

[L MLO tomo · tomo slice 35/70.0]
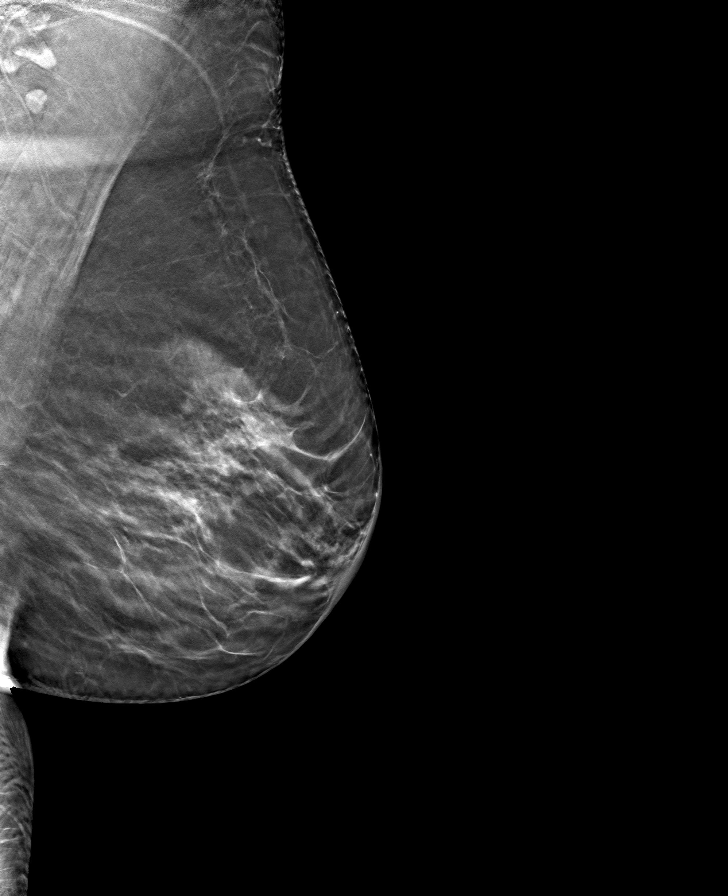

[R MLO tomo · tomo slice 33/66.0]
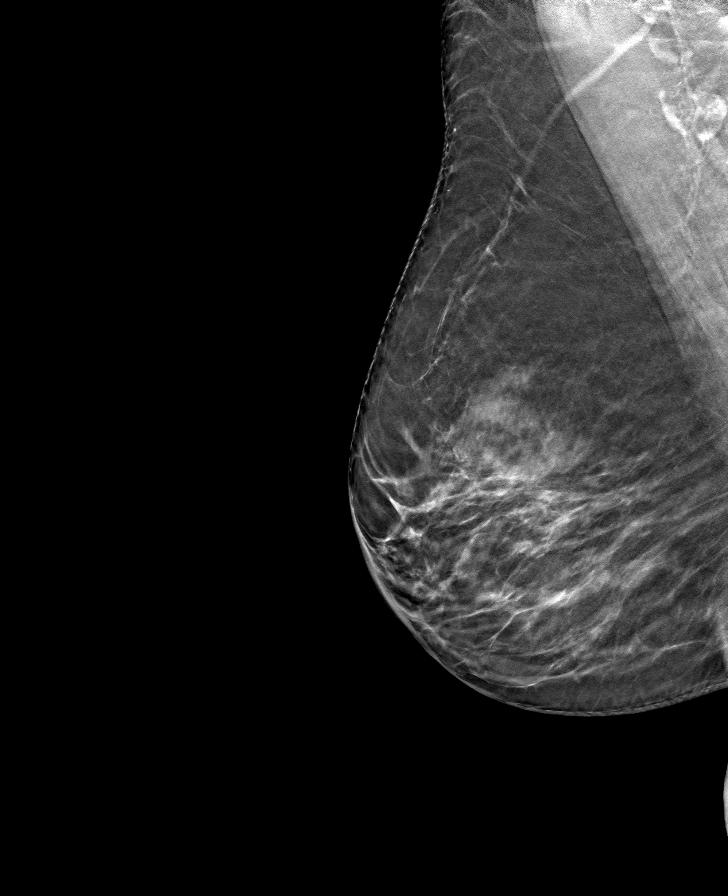

[L CC tomo · tomo slice 33/64.0]
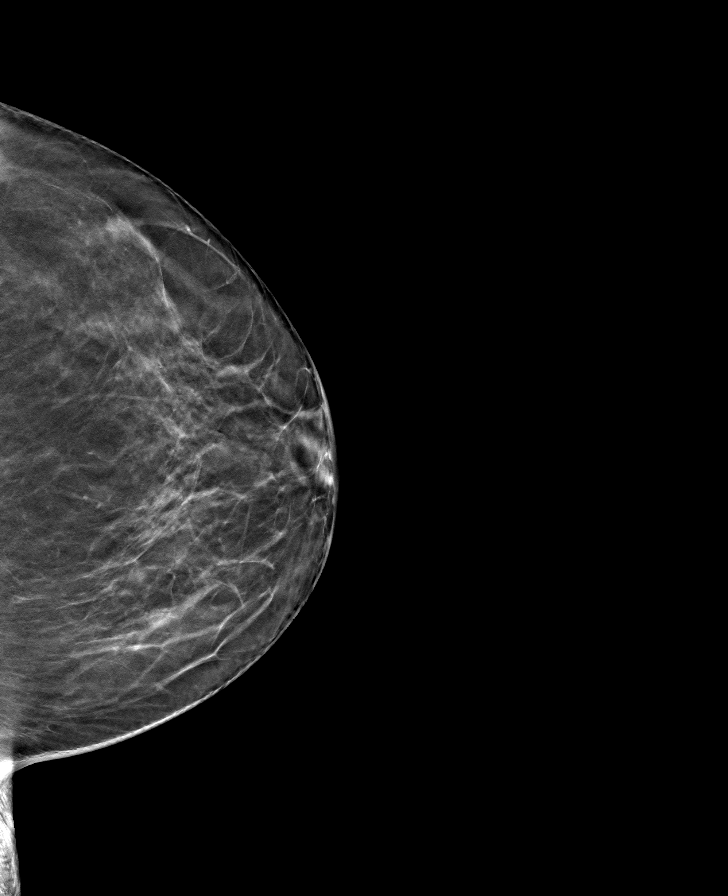

[8 of 24 positions shown; findings below may reference images not displayed]

ACR Breast Density Category b: There are scattered areas of
fibroglandular density.
FINDINGS: There are no findings suspicious for malignancy. Images were
processed with CAD.
IMPRESSION: No mammographic evidence of malignancy. A result letter of this
screening mammogram will be mailed directly to the patient.

RECOMMENDATION:
Screening mammogram in one year. (Code:[id number])

BI-RADS CATEGORY  1: Negative.

## 2021-09-05 ENCOUNTER — Other Ambulatory Visit: Payer: Self-pay | Admitting: Internal Medicine

## 2021-09-06 ENCOUNTER — Other Ambulatory Visit: Payer: Self-pay | Admitting: Internal Medicine

## 2021-09-06 DIAGNOSIS — M8580 Other specified disorders of bone density and structure, unspecified site: Secondary | ICD-10-CM

## 2021-09-27 ENCOUNTER — Other Ambulatory Visit: Payer: Self-pay | Admitting: Internal Medicine

## 2021-09-27 DIAGNOSIS — M8580 Other specified disorders of bone density and structure, unspecified site: Secondary | ICD-10-CM

## 2022-01-14 ENCOUNTER — Other Ambulatory Visit: Payer: Self-pay | Admitting: Internal Medicine

## 2022-01-14 DIAGNOSIS — Z1231 Encounter for screening mammogram for malignant neoplasm of breast: Secondary | ICD-10-CM

## 2022-01-24 ENCOUNTER — Ambulatory Visit
Admission: RE | Admit: 2022-01-24 | Discharge: 2022-01-24 | Disposition: A | Discharge status: Home / Self Care | Payer: Medicare Other | Source: Ambulatory Visit | Attending: Internal Medicine | Admitting: Internal Medicine

## 2022-01-24 DIAGNOSIS — Z1231 Encounter for screening mammogram for malignant neoplasm of breast: Secondary | ICD-10-CM

## 2022-03-07 ENCOUNTER — Other Ambulatory Visit: Payer: Self-pay | Admitting: Internal Medicine

## 2022-08-13 ENCOUNTER — Other Ambulatory Visit: Payer: Self-pay | Admitting: Internal Medicine

## 2022-08-13 DIAGNOSIS — M8580 Other specified disorders of bone density and structure, unspecified site: Secondary | ICD-10-CM

## 2022-08-13 DIAGNOSIS — Z1231 Encounter for screening mammogram for malignant neoplasm of breast: Secondary | ICD-10-CM

## 2022-08-14 ENCOUNTER — Other Ambulatory Visit: Payer: Self-pay | Admitting: Internal Medicine

## 2022-08-14 DIAGNOSIS — E785 Hyperlipidemia, unspecified: Secondary | ICD-10-CM

## 2022-08-18 ENCOUNTER — Other Ambulatory Visit: Payer: Medicare Other

## 2022-09-16 ENCOUNTER — Other Ambulatory Visit: Payer: Medicare Other

## 2022-09-29 ENCOUNTER — Other Ambulatory Visit: Payer: Medicare Other

## 2022-10-16 ENCOUNTER — Ambulatory Visit
Admission: RE | Admit: 2022-10-16 | Discharge: 2022-10-16 | Disposition: A | Discharge status: Home / Self Care | Payer: No Typology Code available for payment source | Source: Ambulatory Visit | Attending: Internal Medicine | Admitting: Internal Medicine

## 2022-10-16 DIAGNOSIS — E785 Hyperlipidemia, unspecified: Secondary | ICD-10-CM

## 2022-11-10 ENCOUNTER — Encounter: Payer: Self-pay | Admitting: Gastroenterology

## 2023-02-03 ENCOUNTER — Ambulatory Visit
Admission: RE | Admit: 2023-02-03 | Discharge: 2023-02-03 | Disposition: A | Discharge status: Home / Self Care | Payer: Medicare Other | Source: Ambulatory Visit | Attending: Internal Medicine | Admitting: Internal Medicine

## 2023-02-03 DIAGNOSIS — M8580 Other specified disorders of bone density and structure, unspecified site: Secondary | ICD-10-CM

## 2023-02-03 DIAGNOSIS — Z1231 Encounter for screening mammogram for malignant neoplasm of breast: Secondary | ICD-10-CM

## 2023-10-26 ENCOUNTER — Encounter: Payer: Self-pay | Admitting: Internal Medicine

## 2024-05-07 ENCOUNTER — Other Ambulatory Visit: Payer: Self-pay

## 2024-05-07 ENCOUNTER — Emergency Department (HOSPITAL_COMMUNITY): Admission: EM | Admit: 2024-05-07 | Discharge: 2024-05-07 | Disposition: A | Discharge status: Home / Self Care

## 2024-05-07 ENCOUNTER — Encounter (HOSPITAL_COMMUNITY): Payer: Self-pay | Admitting: *Deleted

## 2024-05-07 ENCOUNTER — Emergency Department (HOSPITAL_COMMUNITY)

## 2024-05-07 DIAGNOSIS — S0993XA Unspecified injury of face, initial encounter: Secondary | ICD-10-CM | POA: Diagnosis present

## 2024-05-07 DIAGNOSIS — W01198A Fall on same level from slipping, tripping and stumbling with subsequent striking against other object, initial encounter: Secondary | ICD-10-CM | POA: Insufficient documentation

## 2024-05-07 DIAGNOSIS — Z7982 Long term (current) use of aspirin: Secondary | ICD-10-CM | POA: Diagnosis not present

## 2024-05-07 DIAGNOSIS — S0031XA Abrasion of nose, initial encounter: Secondary | ICD-10-CM | POA: Diagnosis not present

## 2024-05-07 DIAGNOSIS — S0083XA Contusion of other part of head, initial encounter: Secondary | ICD-10-CM | POA: Insufficient documentation

## 2024-05-07 NOTE — ED Provider Notes (Signed)
  Physical Exam  BP (!) 176/85 (BP Location: Right Arm)   Pulse 76   Temp (!) 97.5 F (36.4 C)   Resp 18   Ht 5' 5 (1.651 m)   Wt 64 kg   SpO2 100%   BMI 23.46 kg/m   Physical Exam  Procedures  Procedures  ED Course / MDM    Medical Decision Making  Mechanical fall Max, head CT pending  CT negative - can d/ch home  Introduction and recheck: she is feeling very well, no complaints. Relayed negative imaging studies to patient and family. She reports she is ready for discharge home.        Odell Balls, PA-C 05/07/24 1624    Kammerer, Megan L, DO 05/08/24 [phone]

## 2024-05-07 NOTE — Discharge Instructions (Addendum)
 As we discussed, your CT imaging studies do not show any significant injury or fracture. Recommend Tylenol and/or ibuprofen for discomfort. Return to the ED with any new or concerning symptoms at any time.

## 2024-05-07 NOTE — ED Provider Notes (Signed)
 [place] EMERGENCY DEPARTMENT AT [facility] Provider Note   CSN: [id number] Arrival date & time: 05/07/24  1317     Patient presents with: Fall and Facial Pain   Carolyn Gonzales is a 73 y.o. female.   Patient reports that she was shopping at [facility] and stepped on a curb which caused her to trip and fall striking her face.  Patient reports she hit directly on her nose and scraped her glasses.  Patient states she did not lose consciousness.  Patient's husband states that she was able to get up.  He reports she has been acting normally.  Patient states that she was concerned because she hit her head so she came to the emergency department.  Patient denies any nausea or vomiting she is not having any visual changes she is not having any hearing changes she denies any weakness.  Patient states that she scraped her knee but she is not having any pain.  The history is provided by the patient and the spouse. No language interpreter was used.  Fall       Prior to Admission medications   Medication Sig Start Date End Date Taking? Authorizing Provider  aspirin 81 MG EC tablet  12/06/1996   [provider]  atorvastatin  (LIPITOR) 10 MG tablet Take 1 tablet at night 5 times a week 09/19/19   Jodie Lavern CROME, MD  calcium -vitamin D 250-100 MG-UNIT tablet Take 1 tablet by mouth 2 (two) times daily.    [provider]  fluticasone OREN) 50 MCG/ACT nasal spray  05/06/16   [provider]    Allergies: Codeine and Ezetimibe-simvastatin    Review of Systems  All other systems reviewed and are negative.   Updated Vital Signs BP (!) 176/85 (BP Location: Right Arm)   Pulse 76   Temp (!) 97.5 F (36.4 C)   Resp 18   Ht 5' 5 (1.651 m)   Wt 64 kg   SpO2 100%   BMI 23.46 kg/m   Physical Exam Vitals and nursing note reviewed.  Constitutional:      Appearance: She is well-developed.  HENT:     Head: Normocephalic.     Comments: Abrasion mid  nose, bruising left upper orbital rim,    Mouth/Throat:     Mouth: Mucous membranes are moist.  Cardiovascular:     Rate and Rhythm: Normal rate.     Pulses: Normal pulses.  Pulmonary:     Effort: Pulmonary effort is normal.  Abdominal:     General: Abdomen is flat. There is no distension.     Palpations: Abdomen is soft.  Musculoskeletal:        General: Normal range of motion.     Cervical back: Normal range of motion.  Skin:    General: Skin is warm.  Neurological:     Mental Status: She is alert and oriented to person, place, and time.  Psychiatric:        Mood and Affect: Mood normal.     (all labs ordered are listed, but only abnormal results are displayed) Labs Reviewed - No data to display  EKG: None  Radiology: No results found.   Procedures   Medications Ordered in the ED - No data to display                                  Medical Decision Making Patient complains of  pain in her nose and her left forehead.  Amount and/or Complexity of Data Reviewed Radiology: ordered and independent interpretation performed. Decision-making details documented in ED Course.        Final diagnoses:  Abrasion of nose, initial encounter  Contusion of forehead, initial encounter    ED Discharge Orders     None          Flint Sonny POUR, DEVONNA 05/07/24 1544    Kammerer, Megan L, DO 05/08/24 [phone]

## 2024-05-07 NOTE — ED Triage Notes (Signed)
 Pt c/o facial pain following a trip and fall this afternoon.  Pt reports she fell face first after tripping on uneven pavement.  Pt denies blood thinners.   Abrasion noted to bridge of nose.

## 2024-05-07 NOTE — ED Provider Triage Note (Signed)
 Emergency Medicine Provider Triage Evaluation Note  Carolyn Gonzales , a 73 y.o. female  was evaluated in triage.  Pt complains of fall.  Patient states that she tripped on some uneven pavement while walking into a store.  She fell forward and struck her forehead and nasal area on concrete.  No loss of consciousness.  No headache or vomiting.  She is not anticoagulated.  No neck pain.  Review of Systems  Positive: Facial pain, abrasion Negative: Neck pain, loss of consciousness  Physical Exam  BP (!) 176/85 (BP Location: Right Arm)   Pulse 76   Temp (!) 97.5 F (36.4 C)   Resp 18   Ht 5' 5 (1.651 m)   Wt 64 kg   SpO2 100%   BMI 23.46 kg/m  Gen:   Awake, no distress   Resp:  Normal effort  MSK:   Moves extremities without difficulty  Other:  Patient with mild abrasion over the forehead, slightly more significant abrasions over the nasal bone area, no epistaxis  Medical Decision Making  Medically screening exam initiated at 1:40 PM.  Appropriate orders placed.  [name] was informed that the remainder of the evaluation will be completed by another provider, this initial triage assessment does not replace that evaluation, and the importance of remaining in the ED until their evaluation is complete.     Desiderio Chew, PA-C 05/07/24 1341
